# Patient Record
Sex: Female | Born: 1990 | Race: White | Hispanic: No | Marital: Single | Smoking: Never smoker
Health system: Southern US, Community
[De-identification: ages and names within clinical notes are randomized; demographics above are authoritative.]

## PROBLEM LIST (undated history)

## (undated) DIAGNOSIS — N83209 Unspecified ovarian cyst, unspecified side: Secondary | ICD-10-CM

## (undated) DIAGNOSIS — J45909 Unspecified asthma, uncomplicated: Secondary | ICD-10-CM

## (undated) DIAGNOSIS — R51 Headache: Secondary | ICD-10-CM

## (undated) HISTORY — PX: TONSILLECTOMY: SUR1361

---

## 2011-09-02 ENCOUNTER — Encounter (HOSPITAL_COMMUNITY): Payer: Self-pay | Admitting: Emergency Medicine

## 2011-09-02 ENCOUNTER — Emergency Department (HOSPITAL_COMMUNITY)
Admission: EM | Admit: 2011-09-02 | Discharge: 2011-09-03 | Disposition: A | Discharge status: Home / Self Care | Payer: BC Managed Care – HMO | Attending: Emergency Medicine | Admitting: Emergency Medicine

## 2011-09-02 DIAGNOSIS — M79609 Pain in unspecified limb: Secondary | ICD-10-CM | POA: Insufficient documentation

## 2011-09-02 DIAGNOSIS — R252 Cramp and spasm: Secondary | ICD-10-CM

## 2011-09-02 HISTORY — DX: Unspecified asthma, uncomplicated: J45.909

## 2011-09-02 LAB — POCT I-STAT, CHEM 8
BUN: 7 mg/dL (ref 6–23)
Calcium, Ion: 1.17 mmol/L (ref 1.12–1.23)
Chloride: 109 mEq/L (ref 96–112)
Creatinine, Ser: 0.6 mg/dL (ref 0.50–1.10)
Glucose, Bld: 105 mg/dL — ABNORMAL HIGH (ref 70–99)
TCO2: 20 mmol/L (ref 0–100)

## 2011-09-02 MED ORDER — SODIUM CHLORIDE 0.9 % IV BOLUS (SEPSIS)
1000.0000 mL | Freq: Once | INTRAVENOUS | Status: AC
  Administered 2011-09-03: 1000 mL via INTRAVENOUS

## 2011-09-02 MED ORDER — SODIUM CHLORIDE 0.9 % IV BOLUS (SEPSIS)
1000.0000 mL | Freq: Once | INTRAVENOUS | Status: AC
  Administered 2011-09-02: 1000 mL via INTRAVENOUS

## 2011-09-02 NOTE — ED Provider Notes (Signed)
History     CSN: [id number]  Arrival date & time 09/02/11  2118   First MD Initiated Contact with Patient 09/02/11 2227      Chief Complaint  Patient presents with  . Leg Pain    Bilateral calf pain    (Consider location/radiation/quality/duration/timing/severity/associated sxs/prior treatment) Patient is a 21 y.o. female presenting with leg pain. The history is provided by the patient.  Leg Pain   pt here with muscle cramps after running--admits to dehydration--sx better with rest--denies any dark urine. No prior h/o same. No back pain. ems called and pt given iv fluids and now feels better  Past Medical History  Diagnosis Date  . Asthma     Past Surgical History  Procedure Date  . Tonsillectomy     No family history on file.  History  Substance Use Topics  . Smoking status: Never Smoker   . Smokeless tobacco: Not on file  . Alcohol Use: No    OB History    Grav Para Term Preterm Abortions TAB SAB Ect Mult Living                  Review of Systems  All other systems reviewed and are negative.    Allergies  Review of patient's allergies indicates no known allergies.  Home Medications  No current outpatient prescriptions on file.  BP 111/69  Pulse 86  Temp 98.1 F (36.7 C) (Oral)  Resp 16  SpO2 100%  LMP 08/31/2011  Physical Exam  Nursing note and vitals reviewed. Constitutional: She is oriented to person, place, and time. She appears well-developed and well-nourished.  Non-toxic appearance. No distress.  HENT:  Head: Normocephalic and atraumatic.  Eyes: Conjunctivae, EOM and lids are normal. Pupils are equal, round, and reactive to light.  Neck: Normal range of motion. Neck supple. No tracheal deviation present. No mass present.  Cardiovascular: Normal rate, regular rhythm and normal heart sounds.  Exam reveals no gallop.   No murmur heard. Pulmonary/Chest: Effort normal and breath sounds normal. No stridor. No respiratory distress. She has no  decreased breath sounds. She has no wheezes. She has no rhonchi. She has no rales.  Abdominal: Soft. Normal appearance and bowel sounds are normal. She exhibits no distension. There is no tenderness. There is no rebound and no CVA tenderness.  Musculoskeletal: Normal range of motion. She exhibits no edema and no tenderness.  Neurological: She is alert and oriented to person, place, and time. She has normal strength. No cranial nerve deficit or sensory deficit. GCS eye subscore is 4. GCS verbal subscore is 5. GCS motor subscore is 6.  Skin: Skin is warm and dry. No abrasion and no rash noted.  Psychiatric: She has a normal mood and affect. Her speech is normal and behavior is normal.    ED Course  Procedures (including critical care time)  Labs Reviewed - No data to display No results found.   No diagnosis found.    MDM  Pt given iv fluids and she feels better--cramps have resolved and pt stable for d/c        Toy Baker, MD 09/02/11 2349

## 2011-09-02 NOTE — ED Notes (Signed)
PER EMS- pt picked up from [facility] with c/o bilateral calf pain.  Pt went for a run around 8p and afterwards the onset of calf pain started.  Alert and oriented.

## 2012-01-25 ENCOUNTER — Emergency Department (HOSPITAL_COMMUNITY)
Admission: EM | Admit: 2012-01-25 | Discharge: 2012-01-25 | Disposition: A | Discharge status: Home / Self Care | Payer: BC Managed Care – HMO | Attending: Emergency Medicine | Admitting: Emergency Medicine

## 2012-01-25 ENCOUNTER — Encounter (HOSPITAL_COMMUNITY): Payer: Self-pay

## 2012-01-25 DIAGNOSIS — R42 Dizziness and giddiness: Secondary | ICD-10-CM | POA: Insufficient documentation

## 2012-01-25 DIAGNOSIS — Z789 Other specified health status: Secondary | ICD-10-CM

## 2012-01-25 DIAGNOSIS — Z8742 Personal history of other diseases of the female genital tract: Secondary | ICD-10-CM | POA: Insufficient documentation

## 2012-01-25 DIAGNOSIS — R112 Nausea with vomiting, unspecified: Secondary | ICD-10-CM | POA: Insufficient documentation

## 2012-01-25 DIAGNOSIS — F1010 Alcohol abuse, uncomplicated: Secondary | ICD-10-CM | POA: Insufficient documentation

## 2012-01-25 DIAGNOSIS — J45909 Unspecified asthma, uncomplicated: Secondary | ICD-10-CM | POA: Insufficient documentation

## 2012-01-25 DIAGNOSIS — Z3202 Encounter for pregnancy test, result negative: Secondary | ICD-10-CM | POA: Insufficient documentation

## 2012-01-25 DIAGNOSIS — R5383 Other fatigue: Secondary | ICD-10-CM | POA: Insufficient documentation

## 2012-01-25 DIAGNOSIS — R5381 Other malaise: Secondary | ICD-10-CM | POA: Insufficient documentation

## 2012-01-25 HISTORY — DX: Unspecified ovarian cyst, unspecified side: N83.209

## 2012-01-25 LAB — RAPID URINE DRUG SCREEN, HOSP PERFORMED
Amphetamines: NOT DETECTED
Barbiturates: NOT DETECTED
Benzodiazepines: NOT DETECTED
Tetrahydrocannabinol: NOT DETECTED

## 2012-01-25 LAB — URINALYSIS, ROUTINE W REFLEX MICROSCOPIC
Bilirubin Urine: NEGATIVE
Glucose, UA: NEGATIVE mg/dL
Hgb urine dipstick: NEGATIVE
Ketones, ur: NEGATIVE mg/dL
Leukocytes, UA: NEGATIVE
Protein, ur: NEGATIVE mg/dL
pH: 5.5 (ref 5.0–8.0)

## 2012-01-25 LAB — POCT PREGNANCY, URINE: Preg Test, Ur: NEGATIVE

## 2012-01-25 NOTE — ED Provider Notes (Signed)
History    Scribed for Carleene Cooper III, MD, the patient was seen in room TR07C/TR07C . This chart was scribed by Lewanda Rife.  CSN: [id number]  Arrival date & time 01/25/12  1901   First MD Initiated Contact with Patient 01/25/12 2013      Chief Complaint  Patient presents with  . Nausea  . Fatigue    (Consider location/radiation/quality/duration/timing/severity/associated sxs/prior treatment) HPI Madison Strong is a 22 y.o. female who presents to the Emergency Department complaining of constant mild fatigue onset last night. Pt reports she had 2 glasses of alcohol, possibly pot brownies, and pizza last night with unfamiliar "friends." Pt reports waking up this morning "not feeling like myself" with a migraine. Pt reports she feels like sleeping all day, mildly blurry vision, emesis, nausea, and dizzy today. Pt reports she has a low tolerance with alcohol. Pt denies fever, and loss of consciousness. Pt reports she has a hx a migraines. Pt denies having a flu shot this year. Pt denies taking any medications at home to treat migraine.   Past Medical History  Diagnosis Date  . Asthma   . Ovarian cyst     Past Surgical History  Procedure Date  . Tonsillectomy     No family history on file.  History  Substance Use Topics  . Smoking status: Never Smoker   . Smokeless tobacco: Never Used  . Alcohol Use: Yes     Comment: socially on the weekend     OB History    Grav Para Term Preterm Abortions TAB SAB Ect Mult Living                  Review of Systems  Constitutional: Positive for fatigue. Negative for fever.  HENT: Negative.   Respiratory: Negative.   Cardiovascular: Negative.   Gastrointestinal: Positive for nausea and vomiting. Negative for abdominal pain and diarrhea.  Musculoskeletal: Negative.   Skin: Negative.   Neurological: Positive for dizziness.  Hematological: Negative.   Psychiatric/Behavioral: Negative.   All other systems reviewed and are  negative.    Allergies  Other  Home Medications  No current outpatient prescriptions on file.  BP 119/75  Pulse 79  Temp 98 F (36.7 C) (Oral)  Resp 18  SpO2 97%  LMP 10/25/2011  Physical Exam  Nursing note and vitals reviewed. Constitutional: She is oriented to person, place, and time. She appears well-developed and well-nourished. No distress.  HENT:  Head: Normocephalic and atraumatic.  Mouth/Throat: Oropharynx is clear and moist.  Eyes: Conjunctivae normal and EOM are normal.  Cardiovascular: Normal rate and regular rhythm.   Pulmonary/Chest: Effort normal and breath sounds normal. No stridor. No respiratory distress.  Abdominal: She exhibits no distension.  Musculoskeletal: Normal range of motion. She exhibits no edema.  Neurological: She is alert and oriented to person, place, and time. No cranial nerve deficit.       Cranial nerves 2-12 intact. Normal coordination.  Pt ambulates with normal gait in room.  Normal strength.  Normal distal sensation.  Skin: Skin is warm and dry.  Psychiatric: She has a normal mood and affect.    ED Course  Procedures (including critical care time)  Labs Reviewed - No data to display No results found.   No diagnosis found.    MDM  Fatigue, alc consumption last night  Pt presents to the ED not "feeling right" after a night of drinking with unfamiliar people. No focal neuro deficits on exam, non toxic & non septic  appearing. BP 118/81  Pulse 84  Temp 98 F (36.7 C) (Oral)  Resp 18  SpO2 97%  LMP 10/25/2011  Labs reviewed. Strict return precautions discussed.       I personally performed the services described in this documentation, which was scribed in my presence. The recorded information has been reviewed and is accurate.      Jaci Carrel, [place] 01/25/12 2201

## 2012-01-25 NOTE — ED Notes (Signed)
Patient presents with c/o a "hangover." sts that she went out drinking with friends last night and consumed more ETOH than she normally does. Woke up this AM with dizziness, nausea, vomiting and extreme fatigue. Pt sts that she wants to get "checked out" because this is not "a normal feeling" for her after drinking.

## 2012-01-26 NOTE — ED Provider Notes (Signed)
Medical screening examination/treatment/procedure(s) were performed by non-physician practitioner and as supervising physician I was immediately available for consultation/collaboration.   Carleene Cooper III, MD 01/26/12 [phone]

## 2012-04-27 ENCOUNTER — Encounter (HOSPITAL_COMMUNITY): Payer: Self-pay | Admitting: *Deleted

## 2012-04-27 ENCOUNTER — Observation Stay (HOSPITAL_COMMUNITY)
Admission: EM | Admit: 2012-04-27 | Discharge: 2012-04-29 | Disposition: A | Discharge status: Home / Self Care | Payer: BC Managed Care – PPO | Attending: General Surgery | Admitting: General Surgery

## 2012-04-27 DIAGNOSIS — Z9049 Acquired absence of other specified parts of digestive tract: Secondary | ICD-10-CM

## 2012-04-27 DIAGNOSIS — R112 Nausea with vomiting, unspecified: Secondary | ICD-10-CM | POA: Insufficient documentation

## 2012-04-27 DIAGNOSIS — K37 Unspecified appendicitis: Secondary | ICD-10-CM

## 2012-04-27 DIAGNOSIS — K3580 Unspecified acute appendicitis: Principal | ICD-10-CM | POA: Insufficient documentation

## 2012-04-27 DIAGNOSIS — R1011 Right upper quadrant pain: Secondary | ICD-10-CM | POA: Insufficient documentation

## 2012-04-27 DIAGNOSIS — R1031 Right lower quadrant pain: Secondary | ICD-10-CM | POA: Insufficient documentation

## 2012-04-27 HISTORY — DX: Headache: R51

## 2012-04-27 LAB — CBC WITH DIFFERENTIAL/PLATELET
Basophils Absolute: 0 10*3/uL (ref 0.0–0.1)
Basophils Relative: 0 % (ref 0–1)
Eosinophils Absolute: 0.3 10*3/uL (ref 0.0–0.7)
Eosinophils Relative: 3 % (ref 0–5)
HCT: 36.1 % (ref 36.0–46.0)
Hemoglobin: 12.4 g/dL (ref 12.0–15.0)
Lymphocytes Relative: 28 % (ref 12–46)
Lymphs Abs: 2.5 10*3/uL (ref 0.7–4.0)
MCH: 28.8 pg (ref 26.0–34.0)
MCHC: 34.3 g/dL (ref 30.0–36.0)
MCV: 84 fL (ref 78.0–100.0)
Monocytes Absolute: 0.7 10*3/uL (ref 0.1–1.0)
Monocytes Relative: 7 % (ref 3–12)
Neutro Abs: 5.4 10*3/uL (ref 1.7–7.7)
Neutrophils Relative %: 61 % (ref 43–77)
Platelets: 285 10*3/uL (ref 150–400)
RBC: 4.3 MIL/uL (ref 3.87–5.11)
RDW: 12.7 % (ref 11.5–15.5)
WBC: 8.9 10*3/uL (ref 4.0–10.5)

## 2012-04-27 LAB — URINALYSIS, ROUTINE W REFLEX MICROSCOPIC
Bilirubin Urine: NEGATIVE
Glucose, UA: NEGATIVE mg/dL
Hgb urine dipstick: NEGATIVE
Ketones, ur: NEGATIVE mg/dL
Leukocytes, UA: NEGATIVE
Nitrite: NEGATIVE
Protein, ur: NEGATIVE mg/dL
Specific Gravity, Urine: 1.016 (ref 1.005–1.030)
Urobilinogen, UA: 1 mg/dL (ref 0.0–1.0)
pH: 7.5 (ref 5.0–8.0)

## 2012-04-27 LAB — COMPREHENSIVE METABOLIC PANEL
ALT: 13 U/L (ref 0–35)
AST: 17 U/L (ref 0–37)
Albumin: 3.7 g/dL (ref 3.5–5.2)
Alkaline Phosphatase: 51 U/L (ref 39–117)
BUN: 10 mg/dL (ref 6–23)
CO2: 29 mEq/L (ref 19–32)
Calcium: 9.6 mg/dL (ref 8.4–10.5)
Chloride: 105 mEq/L (ref 96–112)
Creatinine, Ser: 0.79 mg/dL (ref 0.50–1.10)
GFR calc Af Amer: >90 mL/min (ref >90)
GFR calc non Af Amer: >90 mL/min (ref >90)
Glucose, Bld: 93 mg/dL (ref 70–99)
Potassium: 3.6 mEq/L (ref 3.5–5.1)
Sodium: 140 mEq/L (ref 135–145)
Total Bilirubin: 0.3 mg/dL (ref 0.3–1.2)
Total Protein: 7.2 g/dL (ref 6.0–8.3)

## 2012-04-27 LAB — URINE MICROSCOPIC-ADD ON

## 2012-04-27 MED ORDER — SODIUM CHLORIDE 0.9 % IV BOLUS (SEPSIS)
1000.0000 mL | Freq: Once | INTRAVENOUS | Status: AC
  Administered 2012-04-27: 1000 mL via INTRAVENOUS

## 2012-04-27 MED ORDER — ONDANSETRON HCL 4 MG/2ML IJ SOLN
INTRAMUSCULAR | Status: AC
  Administered 2012-04-27: 4 mg via INTRAVENOUS
  Filled 2012-04-27: qty 2

## 2012-04-27 MED ORDER — MORPHINE SULFATE 4 MG/ML IJ SOLN
6.0000 mg | Freq: Once | INTRAMUSCULAR | Status: AC
  Administered 2012-04-27: 6 mg via INTRAVENOUS
  Filled 2012-04-27: qty 2

## 2012-04-27 MED ORDER — IOHEXOL 300 MG/ML  SOLN
50.0000 mL | Freq: Once | INTRAMUSCULAR | Status: AC | PRN
  Administered 2012-04-27: 50 mL via ORAL

## 2012-04-27 MED ORDER — ONDANSETRON HCL 4 MG/2ML IJ SOLN
4.0000 mg | Freq: Once | INTRAMUSCULAR | Status: AC
  Administered 2012-04-27: 4 mg via INTRAVENOUS

## 2012-04-27 NOTE — ED Notes (Signed)
Pt complains of sudden onset of cramping pain near belly button that has moved to RLQ and become sharp with N/V

## 2012-04-27 NOTE — ED Notes (Signed)
Pt finished contrast. CT paged.  

## 2012-04-27 NOTE — ED Notes (Signed)
Pt unable to give urine at this time.

## 2012-04-28 ENCOUNTER — Emergency Department (HOSPITAL_COMMUNITY): Payer: BC Managed Care – PPO

## 2012-04-28 ENCOUNTER — Encounter (HOSPITAL_COMMUNITY): Payer: Self-pay | Admitting: Certified Registered"

## 2012-04-28 ENCOUNTER — Encounter (HOSPITAL_COMMUNITY)
Admission: EM | Disposition: A | Discharge status: Home / Self Care | Payer: Self-pay | Source: Home / Self Care | Attending: Emergency Medicine

## 2012-04-28 ENCOUNTER — Observation Stay (HOSPITAL_COMMUNITY): Payer: BC Managed Care – PPO | Admitting: Certified Registered"

## 2012-04-28 ENCOUNTER — Encounter (HOSPITAL_COMMUNITY): Payer: Self-pay | Admitting: Radiology

## 2012-04-28 DIAGNOSIS — K3580 Unspecified acute appendicitis: Secondary | ICD-10-CM

## 2012-04-28 DIAGNOSIS — Z9049 Acquired absence of other specified parts of digestive tract: Secondary | ICD-10-CM

## 2012-04-28 HISTORY — PX: LAPAROSCOPIC APPENDECTOMY: SHX408

## 2012-04-28 HISTORY — PX: APPENDECTOMY: SHX54

## 2012-04-28 SURGERY — APPENDECTOMY, LAPAROSCOPIC
Anesthesia: General | Site: Abdomen | Wound class: Clean Contaminated

## 2012-04-28 MED ORDER — PROPOFOL 10 MG/ML IV BOLUS
INTRAVENOUS | Status: DC | PRN
  Administered 2012-04-28: 180 mg via INTRAVENOUS

## 2012-04-28 MED ORDER — ONDANSETRON HCL 4 MG PO TABS: 4.0000 mg | ORAL_TABLET | Freq: Three times a day (TID) | ORAL | Status: DC | PRN

## 2012-04-28 MED ORDER — SODIUM CHLORIDE 0.9 % IR SOLN
Status: DC | PRN
  Administered 2012-04-28: 1000 mL

## 2012-04-28 MED ORDER — IOHEXOL 300 MG/ML  SOLN
100.0000 mL | Freq: Once | INTRAMUSCULAR | Status: AC | PRN
Start: 2012-04-28 — End: 2012-04-28
  Administered 2012-04-28: 100 mL via INTRAVENOUS

## 2012-04-28 MED ORDER — MIDAZOLAM HCL 5 MG/5ML IJ SOLN
INTRAMUSCULAR | Status: DC | PRN
  Administered 2012-04-28: 2 mg via INTRAVENOUS

## 2012-04-28 MED ORDER — SODIUM CHLORIDE 0.9 % IV BOLUS (SEPSIS)
1000.0000 mL | Freq: Once | INTRAVENOUS | Status: AC
  Administered 2012-04-28: 1000 mL via INTRAVENOUS

## 2012-04-28 MED ORDER — ONDANSETRON HCL 4 MG/2ML IJ SOLN
4.0000 mg | Freq: Four times a day (QID) | INTRAMUSCULAR | Status: DC | PRN
  Administered 2012-04-28: 4 mg via INTRAVENOUS
  Filled 2012-04-28 (×2): qty 2

## 2012-04-28 MED ORDER — SUCCINYLCHOLINE CHLORIDE 20 MG/ML IJ SOLN
INTRAMUSCULAR | Status: DC | PRN
  Administered 2012-04-28: 120 mg via INTRAVENOUS

## 2012-04-28 MED ORDER — ONDANSETRON HCL 4 MG/2ML IJ SOLN
INTRAMUSCULAR | Status: DC | PRN
  Administered 2012-04-28: 4 mg via INTRAVENOUS

## 2012-04-28 MED ORDER — DEXTROSE IN LACTATED RINGERS 5 % IV SOLN
INTRAVENOUS | Status: DC
  Administered 2012-04-28: 03:00:00 via INTRAVENOUS

## 2012-04-28 MED ORDER — BUPIVACAINE HCL 0.25 % IJ SOLN
INTRAMUSCULAR | Status: DC | PRN
  Administered 2012-04-28: 2 mL

## 2012-04-28 MED ORDER — LACTATED RINGERS IV SOLN
INTRAVENOUS | Status: DC | PRN
  Administered 2012-04-28 (×2): via INTRAVENOUS

## 2012-04-28 MED ORDER — HYDROMORPHONE HCL PF 1 MG/ML IJ SOLN
0.5000 mg | INTRAMUSCULAR | Status: DC | PRN
  Administered 2012-04-28: 0.5 mg via INTRAVENOUS
  Filled 2012-04-28: qty 1

## 2012-04-28 MED ORDER — SUFENTANIL CITRATE 50 MCG/ML IV SOLN
INTRAVENOUS | Status: DC | PRN
  Administered 2012-04-28: 10 ug via INTRAVENOUS

## 2012-04-28 MED ORDER — SODIUM CHLORIDE 0.9 % IV SOLN
1.0000 g | Freq: Once | INTRAVENOUS | Status: AC
  Administered 2012-04-28: 1 g via INTRAVENOUS
  Filled 2012-04-28: qty 1

## 2012-04-28 MED ORDER — OXYCODONE HCL 5 MG PO TABS
5.0000 mg | ORAL_TABLET | ORAL | Status: DC | PRN
  Administered 2012-04-28 – 2012-04-29 (×5): 5 mg via ORAL
  Filled 2012-04-28 (×5): qty 1

## 2012-04-28 MED ORDER — DEXAMETHASONE SODIUM PHOSPHATE 4 MG/ML IJ SOLN
INTRAMUSCULAR | Status: DC | PRN
Start: 2012-04-28 — End: 2012-04-28
  Administered 2012-04-28: 4 mg via INTRAVENOUS

## 2012-04-28 MED ORDER — OXYCODONE-ACETAMINOPHEN 5-325 MG PO TABS: 1.0000 | ORAL_TABLET | Freq: Four times a day (QID) | ORAL | Status: DC | PRN

## 2012-04-28 MED ORDER — GLYCOPYRROLATE 0.2 MG/ML IJ SOLN
INTRAMUSCULAR | Status: DC | PRN
  Administered 2012-04-28: 0.4 mg via INTRAVENOUS

## 2012-04-28 MED ORDER — HYDROMORPHONE HCL PF 1 MG/ML IJ SOLN
0.2500 mg | INTRAMUSCULAR | Status: DC | PRN
  Administered 2012-04-28 (×2): 0.5 mg via INTRAVENOUS

## 2012-04-28 MED ORDER — NEOSTIGMINE METHYLSULFATE 1 MG/ML IJ SOLN
INTRAMUSCULAR | Status: DC | PRN
  Administered 2012-04-28: 3 mg via INTRAVENOUS

## 2012-04-28 MED ORDER — LIDOCAINE HCL 4 % MT SOLN
OROMUCOSAL | Status: DC | PRN
  Administered 2012-04-28: 4 mL via TOPICAL

## 2012-04-28 MED ORDER — LIDOCAINE HCL (CARDIAC) 20 MG/ML IV SOLN
INTRAVENOUS | Status: DC | PRN
  Administered 2012-04-28: 60 mg via INTRAVENOUS

## 2012-04-28 MED ORDER — SODIUM CHLORIDE 0.9 % IV SOLN
INTRAVENOUS | Status: DC
  Administered 2012-04-28: 23:00:00 via INTRAVENOUS

## 2012-04-28 MED ORDER — PROMETHAZINE HCL 25 MG/ML IJ SOLN: 6.2500 mg | INTRAMUSCULAR | Status: DC | PRN

## 2012-04-28 MED ORDER — ROCURONIUM BROMIDE 100 MG/10ML IV SOLN
INTRAVENOUS | Status: DC | PRN
  Administered 2012-04-28: 20 mg via INTRAVENOUS

## 2012-04-28 MED ORDER — HYDROMORPHONE HCL PF 1 MG/ML IJ SOLN
1.0000 mg | INTRAMUSCULAR | Status: DC | PRN
  Administered 2012-04-28: 1 mg via INTRAVENOUS
  Filled 2012-04-28: qty 1

## 2012-04-28 MED ORDER — HYDROMORPHONE HCL PF 1 MG/ML IJ SOLN
INTRAMUSCULAR | Status: AC
  Filled 2012-04-28: qty 1

## 2012-04-28 MED ORDER — IBUPROFEN 200 MG PO TABS: 400.0000 mg | ORAL_TABLET | Freq: Three times a day (TID) | ORAL | Status: DC | PRN

## 2012-04-28 SURGICAL SUPPLY — 44 items
APPLIER CLIP 5 13 M/L LIGAMAX5 (MISCELLANEOUS) ×2
BENZOIN TINCTURE PRP APPL 2/3 (GAUZE/BANDAGES/DRESSINGS) ×2 IMPLANT
BLADE SURG ROTATE 9660 (MISCELLANEOUS) IMPLANT
CANISTER SUCTION 2500CC (MISCELLANEOUS) ×2 IMPLANT
CHLORAPREP W/TINT 26ML (MISCELLANEOUS) ×2 IMPLANT
CLIP APPLIE 5 13 M/L LIGAMAX5 (MISCELLANEOUS) ×1 IMPLANT
CLOTH BEACON ORANGE TIMEOUT ST (SAFETY) ×2 IMPLANT
CLSR STERI-STRIP ANTIMIC 1/2X4 (GAUZE/BANDAGES/DRESSINGS) ×2 IMPLANT
COVER SURGICAL LIGHT HANDLE (MISCELLANEOUS) ×2 IMPLANT
COVER TRANSDUCER ULTRASND (DRAPES) ×2 IMPLANT
DECANTER SPIKE VIAL GLASS SM (MISCELLANEOUS) ×2 IMPLANT
DEVICE TROCAR PUNCTURE CLOSURE (ENDOMECHANICALS) ×2 IMPLANT
DRAPE UTILITY 15X26 W/TAPE STR (DRAPE) ×8 IMPLANT
DRSG COVADERM 4X6 (GAUZE/BANDAGES/DRESSINGS) ×2 IMPLANT
ELECT REM PT RETURN 9FT ADLT (ELECTROSURGICAL) ×2
ELECTRODE REM PT RTRN 9FT ADLT (ELECTROSURGICAL) ×1 IMPLANT
ENDOLOOP SUT PDS II  0 18 (SUTURE) ×3
ENDOLOOP SUT PDS II 0 18 (SUTURE) ×3 IMPLANT
GAUZE SPONGE 2X2 8PLY STRL LF (GAUZE/BANDAGES/DRESSINGS) ×1 IMPLANT
GLOVE BIO SURGEON STRL SZ7.5 (GLOVE) ×2 IMPLANT
GLOVE BIOGEL PI IND STRL 7.0 (GLOVE) ×2 IMPLANT
GLOVE BIOGEL PI INDICATOR 7.0 (GLOVE) ×2
GLOVE SURG SS PI 6.5 STRL IVOR (GLOVE) ×2 IMPLANT
GOWN STRL NON-REIN LRG LVL3 (GOWN DISPOSABLE) ×4 IMPLANT
GOWN STRL REIN XL XLG (GOWN DISPOSABLE) ×2 IMPLANT
KIT BASIN OR (CUSTOM PROCEDURE TRAY) ×2 IMPLANT
KIT ROOM TURNOVER OR (KITS) ×2 IMPLANT
NEEDLE INSUFFLATION 14GA 120MM (NEEDLE) ×2 IMPLANT
NS IRRIG 1000ML POUR BTL (IV SOLUTION) ×2 IMPLANT
PAD ARMBOARD 7.5X6 YLW CONV (MISCELLANEOUS) ×4 IMPLANT
SCISSORS LAP 5X35 DISP (ENDOMECHANICALS) ×2 IMPLANT
SET IRRIG TUBING LAPAROSCOPIC (IRRIGATION / IRRIGATOR) ×2 IMPLANT
SLEEVE ENDOPATH XCEL 5M (ENDOMECHANICALS) ×2 IMPLANT
SPECIMEN JAR SMALL (MISCELLANEOUS) ×2 IMPLANT
SPONGE GAUZE 2X2 STER 10/PKG (GAUZE/BANDAGES/DRESSINGS) ×1
SUT MNCRL AB 3-0 PS2 18 (SUTURE) ×2 IMPLANT
SUT VIC AB 1 BRD 54 (SUTURE) ×2 IMPLANT
TAPE CLOTH SOFT 2X10 (GAUZE/BANDAGES/DRESSINGS) ×2 IMPLANT
TOWEL OR 17X24 6PK STRL BLUE (TOWEL DISPOSABLE) ×2 IMPLANT
TOWEL OR 17X26 10 PK STRL BLUE (TOWEL DISPOSABLE) ×2 IMPLANT
TRAY FOLEY CATH 14FR (SET/KITS/TRAYS/PACK) ×2 IMPLANT
TRAY LAPAROSCOPIC (CUSTOM PROCEDURE TRAY) ×2 IMPLANT
TROCAR XCEL NON-BLD 11X100MML (ENDOMECHANICALS) ×2 IMPLANT
TROCAR XCEL NON-BLD 5MMX100MML (ENDOMECHANICALS) ×2 IMPLANT

## 2012-04-28 NOTE — ED Provider Notes (Signed)
1:58 AM PT evaluated - she has RLQ TTP and rebound/ guarding. CT scan unable to visualize appendix. Pelvic was performed by [facility]. Discussed with [facility] DR Derrell Lolling - will see in the ED. NPO, IVFs.   Results for orders placed during the hospital encounter of 04/27/12  CBC WITH DIFFERENTIAL      Result Value Range   WBC 8.9  4.0 - 10.5 K/uL   RBC 4.30  3.87 - 5.11 MIL/uL   Hemoglobin 12.4  12.0 - 15.0 g/dL   HCT 29.5  62.1 - 30.8 %   MCV 84.0  78.0 - 100.0 fL   MCH 28.8  26.0 - 34.0 pg   MCHC 34.3  30.0 - 36.0 g/dL   RDW 65.7  84.6 - 96.2 %   Platelets 285  150 - 400 K/uL   Neutrophils Relative 61  43 - 77 %   Neutro Abs 5.4  1.7 - 7.7 K/uL   Lymphocytes Relative 28  12 - 46 %   Lymphs Abs 2.5  0.7 - 4.0 K/uL   Monocytes Relative 7  3 - 12 %   Monocytes Absolute 0.7  0.1 - 1.0 K/uL   Eosinophils Relative 3  0 - 5 %   Eosinophils Absolute 0.3  0.0 - 0.7 K/uL   Basophils Relative 0  0 - 1 %   Basophils Absolute 0.0  0.0 - 0.1 K/uL  URINALYSIS, ROUTINE W REFLEX MICROSCOPIC      Result Value Range   Color, Urine YELLOW  YELLOW   APPearance TURBID (*) CLEAR   Specific Gravity, Urine 1.016  1.005 - 1.030   pH 7.5  5.0 - 8.0   Glucose, UA NEGATIVE  NEGATIVE mg/dL   Hgb urine dipstick NEGATIVE  NEGATIVE   Bilirubin Urine NEGATIVE  NEGATIVE   Ketones, ur NEGATIVE  NEGATIVE mg/dL   Protein, ur NEGATIVE  NEGATIVE mg/dL   Urobilinogen, UA 1.0  0.0 - 1.0 mg/dL   Nitrite NEGATIVE  NEGATIVE   Leukocytes, UA NEGATIVE  NEGATIVE  COMPREHENSIVE METABOLIC PANEL      Result Value Range   Sodium 140  135 - 145 mEq/L   Potassium 3.6  3.5 - 5.1 mEq/L   Chloride 105  96 - 112 mEq/L   CO2 29  19 - 32 mEq/L   Glucose, Bld 93  70 - 99 mg/dL   BUN 10  6 - 23 mg/dL   Creatinine, Ser 9.52  0.50 - 1.10 mg/dL   Calcium 9.6  8.4 - 84.1 mg/dL   Total Protein 7.2  6.0 - 8.3 g/dL   Albumin 3.7  3.5 - 5.2 g/dL   AST 17  0 - 37 U/L   ALT 13  0 - 35 U/L   Alkaline Phosphatase 51  39 - 117 U/L   Total Bilirubin  0.3  0.3 - 1.2 mg/dL   GFR calc non Af Amer >90  >90 mL/min   GFR calc Af Amer >90  >90 mL/min  URINE MICROSCOPIC-ADD ON      Result Value Range   Squamous Epithelial / LPF RARE  RARE   Bacteria, UA MANY (*) RARE   Urine-Other AMORPHOUS MATERIAL PRESENT    POCT PREGNANCY, URINE      Result Value Range   Preg Test, Ur NEGATIVE  NEGATIVE   Ct Abdomen Pelvis W Contrast  04/28/2012  *RADIOLOGY REPORT*  Clinical Data: Right lower quadrant pain  CT ABDOMEN AND PELVIS WITH CONTRAST  Technique:  Multidetector CT  imaging of the abdomen and pelvis was performed following the standard protocol during bolus administration of intravenous contrast.  Contrast: OMNIPAQUE IOHEXOL 300 MG/ML  SOLN  Comparison: None.  Findings: Limited images through the lung bases demonstrate no significant appreciable abnormality. The heart size is within normal limits. No pleural or pericardial effusion.  Hypodensity adjacent to the falciform ligament, presumably focal fat or variant perfusion.  Otherwise, homogeneous hepatic enhancement.  Unremarkable spleen, biliary system, pancreas, adrenal glands.  Symmetric renal enhancement.  No hydronephrosis or hydroureter.  No CT evidence for colitis.  Appendix not confidently identified. No right lower quadrant inflammation.  No free intraperitoneal air or fluid.  No bowel obstruction.  Small bowel loops are normal course and caliber. No lymphadenopathy.  Normal caliber aorta and branch vessels.  Partially decompressed bladder.  Nonspecific appearance to the uterus and adnexa.  No acute osseous finding.  IMPRESSION: Appendix not identified, therefore cannot exclude appendicitis. However, no right lower quadrant inflammation at this time.   Original Report Authenticated By: Jearld Lesch, M.D.       Sunnie Nielsen, MD 04/28/12 0200

## 2012-04-28 NOTE — ED Notes (Signed)
MD at bedside. 

## 2012-04-28 NOTE — H&P (Signed)
Madison Strong is an 22 y.o. female.   Chief Complaint: abdominal pain HPI: the patient is a 22 year old female with a less than 24-hour history of periumbilical pain localizing to the right lower quadrant. Patient states that the pain was followed by nausea and emesis when she was in the ED. She states she's had history of left-sided cyst, and this pain is not similar to that.  Past Medical History  Diagnosis Date  . Asthma   . Ovarian cyst     Past Surgical History  Procedure Laterality Date  . Tonsillectomy      No family history on file. Social History:  reports that she has never smoked. She has never used smokeless tobacco. She reports that  drinks alcohol. She reports that she does not use illicit drugs.  Allergies:  Allergies  Allergen Reactions  . Other Anaphylaxis    Mosquito   . Doxycycline Other (See Comments)    Caused nose bleeds     (Not in a hospital admission)  Results for orders placed during the hospital encounter of 04/27/12 (from the past 48 hour(s))  CBC WITH DIFFERENTIAL     Status: None   Collection Time    04/27/12  9:54 PM      Result Value Range   WBC 8.9  4.0 - 10.5 K/uL   RBC 4.30  3.87 - 5.11 MIL/uL   Hemoglobin 12.4  12.0 - 15.0 g/dL   HCT 16.1  09.6 - 04.5 %   MCV 84.0  78.0 - 100.0 fL   MCH 28.8  26.0 - 34.0 pg   MCHC 34.3  30.0 - 36.0 g/dL   RDW 40.9  81.1 - 91.4 %   Platelets 285  150 - 400 K/uL   Neutrophils Relative 61  43 - 77 %   Neutro Abs 5.4  1.7 - 7.7 K/uL   Lymphocytes Relative 28  12 - 46 %   Lymphs Abs 2.5  0.7 - 4.0 K/uL   Monocytes Relative 7  3 - 12 %   Monocytes Absolute 0.7  0.1 - 1.0 K/uL   Eosinophils Relative 3  0 - 5 %   Eosinophils Absolute 0.3  0.0 - 0.7 K/uL   Basophils Relative 0  0 - 1 %   Basophils Absolute 0.0  0.0 - 0.1 K/uL  COMPREHENSIVE METABOLIC PANEL     Status: None   Collection Time    04/27/12  9:54 PM      Result Value Range   Sodium 140  135 - 145 mEq/L   Potassium 3.6  3.5 - 5.1 mEq/L    Chloride 105  96 - 112 mEq/L   CO2 29  19 - 32 mEq/L   Glucose, Bld 93  70 - 99 mg/dL   BUN 10  6 - 23 mg/dL   Creatinine, Ser 7.82  0.50 - 1.10 mg/dL   Calcium 9.6  8.4 - 95.6 mg/dL   Total Protein 7.2  6.0 - 8.3 g/dL   Albumin 3.7  3.5 - 5.2 g/dL   AST 17  0 - 37 U/L   ALT 13  0 - 35 U/L   Alkaline Phosphatase 51  39 - 117 U/L   Total Bilirubin 0.3  0.3 - 1.2 mg/dL   GFR calc non Af Amer >90  >90 mL/min   GFR calc Af Amer >90  >90 mL/min   Comment:            The eGFR has been  calculated     using the CKD EPI equation.     This calculation has not been     validated in all clinical     situations.     eGFR's persistently     <90 mL/min signify     possible Chronic Kidney Disease.  URINALYSIS, ROUTINE W REFLEX MICROSCOPIC     Status: Abnormal   Collection Time    04/27/12 10:36 PM      Result Value Range   Color, Urine YELLOW  YELLOW   APPearance TURBID (*) CLEAR   Specific Gravity, Urine 1.016  1.005 - 1.030   pH 7.5  5.0 - 8.0   Glucose, UA NEGATIVE  NEGATIVE mg/dL   Hgb urine dipstick NEGATIVE  NEGATIVE   Bilirubin Urine NEGATIVE  NEGATIVE   Ketones, ur NEGATIVE  NEGATIVE mg/dL   Protein, ur NEGATIVE  NEGATIVE mg/dL   Urobilinogen, UA 1.0  0.0 - 1.0 mg/dL   Nitrite NEGATIVE  NEGATIVE   Leukocytes, UA NEGATIVE  NEGATIVE  URINE MICROSCOPIC-ADD ON     Status: Abnormal   Collection Time    04/27/12 10:36 PM      Result Value Range   Squamous Epithelial / LPF RARE  RARE   Bacteria, UA MANY (*) RARE   Urine-Other AMORPHOUS MATERIAL PRESENT    POCT PREGNANCY, URINE     Status: None   Collection Time    04/27/12 10:43 PM      Result Value Range   Preg Test, Ur NEGATIVE  NEGATIVE   Comment:            THE SENSITIVITY OF THIS     METHODOLOGY IS >24 mIU/mL   Ct Abdomen Pelvis W Contrast  04/28/2012  *RADIOLOGY REPORT*  Clinical Data: Right lower quadrant pain  CT ABDOMEN AND PELVIS WITH CONTRAST  Technique:  Multidetector CT imaging of the abdomen and pelvis was  performed following the standard protocol during bolus administration of intravenous contrast.  Contrast: OMNIPAQUE IOHEXOL 300 MG/ML  SOLN  Comparison: None.  Findings: Limited images through the lung bases demonstrate no significant appreciable abnormality. The heart size is within normal limits. No pleural or pericardial effusion.  Hypodensity adjacent to the falciform ligament, presumably focal fat or variant perfusion.  Otherwise, homogeneous hepatic enhancement.  Unremarkable spleen, biliary system, pancreas, adrenal glands.  Symmetric renal enhancement.  No hydronephrosis or hydroureter.  No CT evidence for colitis.  Appendix not confidently identified. No right lower quadrant inflammation.  No free intraperitoneal air or fluid.  No bowel obstruction.  Small bowel loops are normal course and caliber. No lymphadenopathy.  Normal caliber aorta and branch vessels.  Partially decompressed bladder.  Nonspecific appearance to the uterus and adnexa.  No acute osseous finding.  IMPRESSION: Appendix not identified, therefore cannot exclude appendicitis. However, no right lower quadrant inflammation at this time.   Original Report Authenticated By: Jearld Lesch, M.D.     Review of Systems  Constitutional: Positive for malaise/fatigue.  HENT: Negative.   Eyes: Negative.   Respiratory: Negative.   Cardiovascular: Negative.   Gastrointestinal: Positive for nausea, vomiting and abdominal pain. Negative for diarrhea and constipation.  Genitourinary: Negative.   Musculoskeletal: Negative.   Skin: Negative.   Neurological: Negative.     Blood pressure 101/59, pulse 80, temperature 98.4 F (36.9 C), temperature source Oral, resp. rate 16, last menstrual period 04/13/2012, SpO2 99.00%. Physical Exam  Constitutional: She is oriented to person, place, and time. She appears well-developed  and well-nourished.  HENT:  Head: Normocephalic and atraumatic.  Eyes: Conjunctivae and EOM are normal. Pupils  are equal, round, and reactive to light.  Neck: Normal range of motion. Neck supple.  Cardiovascular: Normal rate and regular rhythm.   Respiratory: Effort normal and breath sounds normal.  GI: Soft. She exhibits no mass. There is tenderness. There is rebound and guarding.  Positive Rovnig's sign, Positive obturator sign  Musculoskeletal: Normal range of motion.  Neurological: She is alert and oriented to person, place, and time.     Assessment/Plan 22 year old female with physical exam findings consistent with acute appendicitis. I discussed with the patient that even though the CT scan did not reveal acute appendicitis is to likely based on her physical exam findings and history. Will proceed for laparoscopic appendectomy. All risks and benefits were discussed with the patient to include bleeding, infection, damage to surrounding structures, and  Leak.  The patient voiced understanding and wished to proceed with the procedure. Marigene Ehlers., Rosendo Couser 04/28/2012, 3:12 AM

## 2012-04-28 NOTE — ED Provider Notes (Signed)
History     CSN: [id number]  Arrival date & time 04/27/12  2048   First MD Initiated Contact with Patient 04/27/12 2124      Chief Complaint  Patient presents with  . Abdominal Pain  . Emesis    (Consider location/radiation/quality/duration/timing/severity/associated sxs/prior treatment) Patient is a 22 y.o. female presenting with abdominal pain and vomiting.  Abdominal Pain Associated symptoms: vomiting   Emesis Associated symptoms: abdominal pain    Patient presents emergency department with right lower quadrant abdominal pain, that began around noon today.  Patient, states, that pain started around her bellybutton and then moved to her right lower quadrant.  Patient, states, that movement and palpation and walking make her pain, worse.  Patient denies chest pain, shortness of breath, diarrhea, bloody stools, fever, headache, blurred vision, rash, dizziness, or syncope.  Patient, states she's had nausea and vomiting associated with her pain.  Patient also denies vaginal discharge or bleeding.  Patient, states she did not take anything prior to arrival for her symptoms Past Medical History  Diagnosis Date  . Asthma   . Ovarian cyst     Past Surgical History  Procedure Laterality Date  . Tonsillectomy      No family history on file.  History  Substance Use Topics  . Smoking status: Never Smoker   . Smokeless tobacco: Never Used  . Alcohol Use: Yes     Comment: socially on the weekend     OB History   Grav Para Term Preterm Abortions TAB SAB Ect Mult Living                  Review of Systems  Gastrointestinal: Positive for vomiting and abdominal pain.   All other systems negative except as documented in the HPI. All pertinent positives and negatives as reviewed in the HPI. Allergies  Other and Doxycycline  Home Medications   Current Outpatient Rx  Name  Route  Sig  Dispense  Refill  . Green Tea POWD   Does not apply   2 tablets by Does not apply route 2  (two) times daily.         . Multiple Vitamin (MULTIVITAMIN WITH MINERALS) TABS   Oral   Take 1 tablet by mouth daily.           BP 124/84  Pulse 58  Temp(Src) 98.3 F (36.8 C) (Oral)  Resp 14  SpO2 100%  LMP 04/13/2012  Physical Exam  Nursing note and vitals reviewed. Constitutional: She is oriented to person, place, and time. She appears well-developed and well-nourished. No distress.  HENT:  Head: Normocephalic and atraumatic.  Mouth/Throat: Oropharynx is clear and moist.  Eyes: Pupils are equal, round, and reactive to light.  Neck: Normal range of motion. Neck supple.  Cardiovascular: Normal rate, regular rhythm and normal heart sounds.  Exam reveals no gallop and no friction rub.   No murmur heard. Pulmonary/Chest: Effort normal and breath sounds normal. No respiratory distress.  Abdominal: Normal appearance and bowel sounds are normal. There is tenderness in the right lower quadrant. There is rebound and guarding. There is no rigidity and no CVA tenderness.    Neurological: She is alert and oriented to person, place, and time. She exhibits normal muscle tone. Coordination normal.  Skin: Skin is warm and dry. No rash noted. No erythema.    ED Course  Procedures (including critical care time)  Labs Reviewed  URINALYSIS, ROUTINE W REFLEX MICROSCOPIC - Abnormal; Notable for the following:  APPearance TURBID (*)    All other components within normal limits  URINE MICROSCOPIC-ADD ON - Abnormal; Notable for the following:    Bacteria, UA MANY (*)    All other components within normal limits  CBC WITH DIFFERENTIAL  COMPREHENSIVE METABOLIC PANEL  POCT PREGNANCY, URINE   patient was observed by Dr. Dierdre Highman, who will speak with surgery, about the patient.  Patient is, otherwise stable.  Reexamination, she is still exquisitely tender, with guarding and rebound in the right lower quadrant.  Patient is advised of the results, plan    MDM  MDM Reviewed: vitals and  nursing note Interpretation: labs and CT scan            Carlyle Dolly, PA-C 04/29/12 0203

## 2012-04-28 NOTE — ED Notes (Signed)
RN paged CT about pt status for CT scan

## 2012-04-28 NOTE — Transfer of Care (Signed)
Immediate Anesthesia Transfer of Care Note  Patient: Madison Strong  Procedure(s) Performed: Procedure(s): APPENDECTOMY LAPAROSCOPIC (N/A)  Patient Location: PACU  Anesthesia Type:General  Level of Consciousness: awake, alert , oriented and patient cooperative  Airway & Oxygen Therapy: Patient Spontanous Breathing and Patient connected to nasal cannula oxygen  Post-op Assessment: Report given to PACU RN, Post -op Vital signs reviewed and stable and Patient moving all extremities X 4  Post vital signs: Reviewed and stable  Complications: No apparent anesthesia complications

## 2012-04-28 NOTE — ED Notes (Signed)
Surgeon at bedside.  

## 2012-04-28 NOTE — ED Notes (Signed)
Pt transported to CT ?

## 2012-04-28 NOTE — Anesthesia Postprocedure Evaluation (Signed)
  Anesthesia Post-op Note  Patient: Chemical engineer  Procedure(s) Performed: Procedure(s): APPENDECTOMY LAPAROSCOPIC (N/A)  Patient Location: PACU  Anesthesia Type:General  Level of Consciousness: awake  Airway and Oxygen Therapy: Patient Spontanous Breathing  Post-op Pain: mild  Post-op Assessment: Post-op Vital signs reviewed, Patient's Cardiovascular Status Stable, Respiratory Function Stable, Patent Airway and Pain level controlled  Post-op Vital Signs: stable  Complications: No apparent anesthesia complications

## 2012-04-28 NOTE — ED Notes (Signed)
Lab paged to send [facility] stat

## 2012-04-28 NOTE — Progress Notes (Signed)
Day of Surgery  Subjective: Pt drowsy, but easily arousable.  Denies fever or chills.  Denies nausea or vomiting.  Complains of abdominal pain with movement.  Has not voided, -flauts/bm.  Has not been up or eaten breakfast.  Mother and father at bedside.  Pt requested to have oxygen removed, which was done.  Asked nursing to recheck 02 sat in and call.  Objective: Vital signs in last 24 hours: Temp:  [96.7 F (35.9 C)-98.4 F (36.9 C)] 97.5 F (36.4 C) (04/23 0627) Pulse Rate:  [55-94] 78 (04/23 0627) Resp:  [10-21] 18 (04/23 0627) BP: (92-124)/(55-84) 106/59 mmHg (04/23 0627) SpO2:  [96 %-100 %] 100 % (04/23 0627) Weight:  [120 lb (54.432 kg)] 120 lb (54.432 kg) (04/23 0358)    Intake/Output from previous day: 04/22 0701 - 04/23 0700 In: 3300 [I.V.:3300] Out: 350 [Urine:350] Intake/Output this shift:    General appearance: alert, cooperative and no distress Resp: clear to auscultation bilaterally Cardio: regular rate and rhythm, S1, S2 normal, no murmur, click, rub or gallop GI: soft, round, mild tenderness, +bs x4 quadrants.  No masses, hernias or organomegaly.  Incisions are clean dry and intact  Lab Results:   Recent Labs  04/27/12 2154  WBC 8.9  HGB 12.4  HCT 36.1  PLT 285   BMET  Recent Labs  04/27/12 2154  NA 140  K 3.6  CL 105  CO2 29  GLUCOSE 93  BUN 10  CREATININE 0.79  CALCIUM 9.6   PT/INR No results found for this basename: LABPROT, INR,  in the last 72 hours ABG No results found for this basename: PHART, PCO2, PO2, HCO3,  in the last 72 hours  Studies/Results: Ct Abdomen Pelvis W Contrast  04/28/2012  *RADIOLOGY REPORT*  Clinical Data: Right lower quadrant pain  CT ABDOMEN AND PELVIS WITH CONTRAST  Technique:  Multidetector CT imaging of the abdomen and pelvis was performed following the standard protocol during bolus administration of intravenous contrast.  Contrast: OMNIPAQUE IOHEXOL 300 MG/ML  SOLN  Comparison: None.   Findings: Limited images through the lung bases demonstrate no significant appreciable abnormality. The heart size is within normal limits. No pleural or pericardial effusion.  Hypodensity adjacent to the falciform ligament, presumably focal fat or variant perfusion.  Otherwise, homogeneous hepatic enhancement.  Unremarkable spleen, biliary system, pancreas, adrenal glands.  Symmetric renal enhancement.  No hydronephrosis or hydroureter.  No CT evidence for colitis.  Appendix not confidently identified. No right lower quadrant inflammation.  No free intraperitoneal air or fluid.  No bowel obstruction.  Small bowel loops are normal course and caliber. No lymphadenopathy.  Normal caliber aorta and branch vessels.  Partially decompressed bladder.  Nonspecific appearance to the uterus and adnexa.  No acute osseous finding.  IMPRESSION: Appendix not identified, therefore cannot exclude appendicitis. However, no right lower quadrant inflammation at this time.   Original Report Authenticated By: Jearld Lesch, M.D.     Anti-infectives: Anti-infectives   Start     Dose/Rate Route Frequency Ordered Stop   04/28/12 0315  ertapenem (INVANZ) 1 g in sodium chloride 0.9 % 50 mL IVPB     1 g 100 mL/hr over 30 Minutes Intravenous  Once 04/28/12 0304 04/28/12 0358      Assessment/Plan: Laparoscopic Appendectomy without perforation -ambulate -pain control, hold for oversedation. -IV fluids, then saline lock when tolerating oral fluids. -Advance diet as tolerated. -May be discharged later today if pt has voided, tolerating diet and pain adequately controlled with  oral medication -oxygen discontinued, after dc 02 sat 93% -will check on pt later   LOS: 1 day    Brigetta Beckstrom  ANP-BC  04/28/2012

## 2012-04-28 NOTE — Op Note (Signed)
Pre Operative Diagnosis:  Acute appendicitis  Post Operative Diagnosis: same  Procedure: Laparoscopic appendectomy  Surgeon: Dr. Axel Filler  Assistant: none  Anesthesia: GETA  EBL:  <5 cc  Complications: none  Counts: reported as correct x 2  Findings:  The patient had an acutely tip inflamed nonperforated appendix  Specimen: Appendix  Indications for procedure:  The patient is a 22 year old female with a history of periumbilical pain localized in the right lower quadrant patient had a CT scan could not visualize the appendix, but however After counseling and secondary to her physical exam and history the patient opted for lap appendectomy  Details of the procedure:The patient was taken back to the operating room. The patient was placed in supine position with bilateral SCDs in place. After appropriate anitbiotics were confirmed, a time-out was confirmed and all facts were verified.  A pneumoperitoneum of 14 mmHg was obtained via a Veress needle technique in the left lower quadrant quadrant.  A 5 mm trocar and 5 mm camera then placed intra-abdominally there is no injury to any intra-abdominal organs a 10 mm infraumbilical port was placed and direct visualization as was a 5 mm port in the suprapubic area. The appendix was identified  The appendix identified and cleaned down to the appendiceal base. The appendiceal artery was taken with Bovie cautery maintaining hemostasis, the mesoappendix was then incised.  The the appendiceal base was clean.  At this time an Endoloop was placed proximallyx2 and one distally and the appendix was transected between these 2. Then an Endo Catch bag was then placed into the abdomen and the specimen placed in Endo Catch bag. The appendiceal stump was cauterized. We evacuate the fluid from the pelvis until the effluent was clear. What omentum could be found was brought over the appendiceal stump. The specimen in the Endo Catch bag and removed via the 10 mm  port site. A #1 Vicryl was used to reapproximate the fascia at the umbilical port site x1. The skin was reapproximated all port sites 3-0 Monocryl subcuticular fashion. The skin was dressed with Steri-Strips gauze and tape. The patient was awakened from general anesthesia was taken to recovery room in stable condition.

## 2012-04-28 NOTE — Progress Notes (Signed)
Persistent nausea.  Will keep overnight.  Marta Lamas. Gae Bon, MD, FACS [phone] [facility]

## 2012-04-28 NOTE — Anesthesia Preprocedure Evaluation (Addendum)
Anesthesia Evaluation  Patient identified by MRN, date of birth, ID band Patient awake    Reviewed: Allergy & Precautions, H&P , NPO status , Patient's Chart, lab work & pertinent test results  Airway Mallampati: I TM Distance: <3 FB Neck ROM: full    Dental  (+) Teeth Intact   Pulmonary neg pulmonary ROS, asthma ,  breath sounds clear to auscultation        Cardiovascular negative cardio ROS  Rhythm:Regular Rate:Normal     Neuro/Psych negative neurological ROS  negative psych ROS   GI/Hepatic negative GI ROS, Neg liver ROS,   Endo/Other  negative endocrine ROS  Renal/GU negative Renal ROS  negative genitourinary   Musculoskeletal   Abdominal (+)  Abdomen: tender.    Peds  Hematology negative hematology ROS (+)   Anesthesia Other Findings   Reproductive/Obstetrics negative OB ROS                          Anesthesia Physical Anesthesia Plan  ASA: I and emergent  Anesthesia Plan: General   Post-op Pain Management:    Induction: Intravenous, Rapid sequence and Cricoid pressure planned  Airway Management Planned: Oral ETT  Additional Equipment:   Intra-op Plan:   Post-operative Plan: Extubation in OR  Informed Consent: I have reviewed the patients History and Physical, chart, labs and discussed the procedure including the risks, benefits and alternatives for the proposed anesthesia with the patient or authorized representative who has indicated his/her understanding and acceptance.   Dental Advisory Given  Plan Discussed with: Surgeon and CRNA  Anesthesia Plan Comments:        Anesthesia Quick Evaluation

## 2012-04-28 NOTE — ED Notes (Signed)
Roommates number to contact for updates [phone]. Roommates name Cathlean Sauer

## 2012-04-29 ENCOUNTER — Encounter (HOSPITAL_COMMUNITY): Payer: Self-pay | Admitting: General Surgery

## 2012-04-29 NOTE — ED Provider Notes (Signed)
Medical screening examination/treatment/procedure(s) were conducted as a shared visit with non-physician practitioner(s) and myself.  I personally evaluated the patient during the encounter. RLQ pain with clinical appendicitis   Sunnie Nielsen, MD 04/29/12 [phone]

## 2012-04-29 NOTE — Progress Notes (Signed)
Discharge patient, home discharge instruction given, no questions verbalized. 

## 2012-04-29 NOTE — Discharge Summary (Signed)
Okay to go home after appendectomy.  Madison Strong. Gae Bon, MD, FACS [phone] [facility]

## 2012-04-29 NOTE — Discharge Summary (Signed)
Physician Discharge Summary  Alexine Pilant ZOX:[id number] DOB: 14-Jan-1990 DOA: 04/27/2012  PCP: No PCP Per Patient  Consultation: none  Admit date: 04/27/2012 Discharge date: 04/29/2012  Recommendations for Outpatient Follow-up:  1. Offered to make a follow up appointment for the patient, she will call as she will be going back to [facility] May 2nd. 2. Follow up with pcp 1-2 weeks  Follow-up Information   Follow up with [facility]. Schedule an appointment as soon as possible for a visit in 2 weeks. (please call and ask for [facility] to schedule a follow up)    Contact information:   [address]   [place] [address] [phone]      Discharge Diagnoses:  1. Acute Appendicitis without perforation  Surgical Procedure: Laparoscopic Appendectomy (Dr. Derrell Lolling 04/28/12)  Discharge Condition: stable   Diet recommendation: Regular  Filed Weights   04/28/12 0358  Weight: 120 lb (54.432 kg)       Hospital Course:  This is a healthy 22 year old female who presented to [facility] ED with RUQ abdominal pain.  Radiologic studies unable to visualize appendix and therefore surgery was consulted.  Her symptoms were consistent with acute appendicitis and the patient preceded with laparoscopic appendectomy by Dr. Derrell Lolling.  On POD #1 she was voiding without difficulties, +flatus -BM.  She developed nausea and her pain worsened.  Therefore, the patient was kept overnight for IV antiemetics and pain management.  On POD #2 she was ambulating, tolerating a regular diet and her pain was better controlled with oral medication.  Her nausea was resolved.  Vital signs remained stable throughout her hospital course.  Normal laboratory evaluation.  She was stable for discharge on POD #2.  Dressings removed, steristrips are intact incisions are clean dry and intact.  After care, diet and activity were discussed. Discussed avoiding bathtubs and pools for [redacted] weeks along with  weight restriction for 3 weeks.  Pt verbalizes understanding.  She was provided with prescription for pain medication, antiemetics.  Additionally she may take aleve and apply ice to area prn for pain.  ROS: NAD, denies fever, chills or sweats.  Denies chest pains.  Denies nausea and vomiting.  Denies dysuria.  BM this am.  Physical Exam General: WNWD female, NAD.  Afebrile.  VSS Lungs: CTA Cardiac: S1S2 RRR, no murmurs, gallops.  No edema. Abdomen: soft, round and NT.  +BS x4 quadrants.  Incisions are CDI.  No masses, hernias or organomegaly.   Discharge Instructions     Medication List    TAKE these medications       Green Tea Powd  2 tablets by Does not apply route 2 (two) times daily.     ibuprofen 200 MG tablet  Commonly known as:  ADVIL  Take 2 tablets (400 mg total) by mouth every 8 (eight) hours as needed for pain.     multivitamin with minerals Tabs  Take 1 tablet by mouth daily.     ondansetron 4 MG tablet  Commonly known as:  ZOFRAN  Take 1 tablet (4 mg total) by mouth every 8 (eight) hours as needed for nausea.     oxyCODONE-acetaminophen 5-325 MG per tablet  Commonly known as:  ROXICET  Take 1 tablet by mouth every 6 (six) hours as needed for pain.           Follow-up Information   Follow up with [facility]. Schedule an appointment  as soon as possible for a visit in 2 weeks. (please call and ask for [facility] to schedule a follow up)    Contact information:   [address]   [place] [address] [phone]        The results of significant diagnostics from this hospitalization (including imaging, microbiology, ancillary and laboratory) are listed below for reference.    Significant Diagnostic Studies: Ct Abdomen Pelvis W Contrast  04/28/2012  *RADIOLOGY REPORT*  Clinical Data: Right lower quadrant pain  CT ABDOMEN AND PELVIS WITH CONTRAST  Technique:  Multidetector CT imaging of the abdomen and pelvis was  performed following the standard protocol during bolus administration of intravenous contrast.  Contrast: OMNIPAQUE IOHEXOL 300 MG/ML  SOLN  Comparison: None.  Findings: Limited images through the lung bases demonstrate no significant appreciable abnormality. The heart size is within normal limits. No pleural or pericardial effusion.  Hypodensity adjacent to the falciform ligament, presumably focal fat or variant perfusion.  Otherwise, homogeneous hepatic enhancement.  Unremarkable spleen, biliary system, pancreas, adrenal glands.  Symmetric renal enhancement.  No hydronephrosis or hydroureter.  No CT evidence for colitis.  Appendix not confidently identified. No right lower quadrant inflammation.  No free intraperitoneal air or fluid.  No bowel obstruction.  Small bowel loops are normal course and caliber. No lymphadenopathy.  Normal caliber aorta and branch vessels.  Partially decompressed bladder.  Nonspecific appearance to the uterus and adnexa.  No acute osseous finding.  IMPRESSION: Appendix not identified, therefore cannot exclude appendicitis. However, no right lower quadrant inflammation at this time.   Original Report Authenticated By: Jearld Lesch, M.D.     Microbiology: No results found for this or any previous visit (from the past 240 hour(s)).   Labs: Basic Metabolic Panel:  Recent Labs Lab 04/27/12 2154  NA 140  K 3.6  CL 105  CO2 29  GLUCOSE 93  BUN 10  CREATININE 0.79  CALCIUM 9.6   Liver Function Tests:  Recent Labs Lab 04/27/12 2154  AST 17  ALT 13  ALKPHOS 51  BILITOT 0.3  PROT 7.2  ALBUMIN 3.7     Recent Labs Lab 04/27/12 2154  WBC 8.9  NEUTROABS 5.4  HGB 12.4  HCT 36.1  MCV 84.0  PLT 285   Active Problems:   Acute appendicitis without mention of peritonitis   S/P appendectomy   Signed:  Zeriah Baysinger, ANP-BC

## 2012-05-04 ENCOUNTER — Emergency Department (HOSPITAL_COMMUNITY)
Admission: EM | Admit: 2012-05-04 | Discharge: 2012-05-04 | Disposition: A | Discharge status: Home / Self Care | Payer: BC Managed Care – PPO | Attending: Emergency Medicine | Admitting: Emergency Medicine

## 2012-05-04 ENCOUNTER — Encounter (HOSPITAL_COMMUNITY): Payer: Self-pay | Admitting: *Deleted

## 2012-05-04 DIAGNOSIS — Z9089 Acquired absence of other organs: Secondary | ICD-10-CM | POA: Insufficient documentation

## 2012-05-04 DIAGNOSIS — J45909 Unspecified asthma, uncomplicated: Secondary | ICD-10-CM | POA: Insufficient documentation

## 2012-05-04 DIAGNOSIS — Z8742 Personal history of other diseases of the female genital tract: Secondary | ICD-10-CM | POA: Insufficient documentation

## 2012-05-04 DIAGNOSIS — Z8669 Personal history of other diseases of the nervous system and sense organs: Secondary | ICD-10-CM | POA: Insufficient documentation

## 2012-05-04 DIAGNOSIS — Z79899 Other long term (current) drug therapy: Secondary | ICD-10-CM | POA: Insufficient documentation

## 2012-05-04 DIAGNOSIS — R1084 Generalized abdominal pain: Secondary | ICD-10-CM | POA: Insufficient documentation

## 2012-05-04 DIAGNOSIS — K5900 Constipation, unspecified: Secondary | ICD-10-CM | POA: Insufficient documentation

## 2012-05-04 MED ORDER — POLYETHYLENE GLYCOL 3350 17 G PO PACK: 17.0000 g | PACK | Freq: Every day | ORAL | Status: DC

## 2012-05-04 MED ORDER — DOCUSATE SODIUM 100 MG PO CAPS
100.0000 mg | ORAL_CAPSULE | Freq: Once | ORAL | Status: AC
  Administered 2012-05-04: 100 mg via ORAL
  Filled 2012-05-04: qty 1

## 2012-05-04 NOTE — ED Provider Notes (Signed)
Medical screening examination/treatment/procedure(s) were performed by non-physician practitioner and as supervising physician I was immediately available for consultation/collaboration.  Olivia Mackie, MD 05/04/12 [phone]

## 2012-05-04 NOTE — ED Notes (Signed)
Recent appendectomy last wednesday, c/o constipation since then with increase in abd pain monday around 1200. Last BM monday 1300 (hard and painful). Last BM prior to yesterday was 1 week ago. No meds taken to produce yesterday's BM. Seen by school clinic and given MOM. Has been taking oxycodone & zofran.

## 2012-05-04 NOTE — ED Provider Notes (Signed)
History     CSN: [id number]  Arrival date & time 05/04/12  0053   None     Chief Complaint  Patient presents with  . Abdominal Pain  . Constipation    (Consider location/radiation/quality/duration/timing/severity/associated sxs/prior treatment) HPI History provided by pt.   Pt had an appendectomy last week and has been on oxycodone for pain.  Had first post-op bowel movement today, and it was severely hard and painful.   She has had persistent pain at her rectum ever since as well as diffuse abdominal "soreness" and bloating and nausea.  Denies fever, hematochezia/melena and GU sx.   Past Medical History  Diagnosis Date  . Asthma   . Ovarian cyst   . Headache     Past Surgical History  Procedure Laterality Date  . Tonsillectomy    . Appendectomy  04/28/2012  . Laparoscopic appendectomy N/A 04/28/2012    Procedure: APPENDECTOMY LAPAROSCOPIC;  Surgeon: Axel Filler, MD;  Location: [facility] OR;  Service: General;  Laterality: N/A;    No family history on file.  History  Substance Use Topics  . Smoking status: Never Smoker   . Smokeless tobacco: Never Used  . Alcohol Use: Yes     Comment: socially on the weekend     OB History   Grav Para Term Preterm Abortions TAB SAB Ect Mult Living                  Review of Systems  All other systems reviewed and are negative.    Allergies  Other and Doxycycline  Home Medications   Current Outpatient Rx  Name  Route  Sig  Dispense  Refill  . Green Tea POWD   Does not apply   2 tablets by Does not apply route 2 (two) times daily.         Marland Kitchen ibuprofen (ADVIL) 200 MG tablet   Oral   Take 2 tablets (400 mg total) by mouth every 8 (eight) hours as needed for pain.   30 tablet   0   . Multiple Vitamin (MULTIVITAMIN WITH MINERALS) TABS   Oral   Take 1 tablet by mouth daily.         . ondansetron (ZOFRAN) 4 MG tablet   Oral   Take 1 tablet (4 mg total) by mouth every 8 (eight) hours as needed for nausea.   20  tablet   0   . oxyCODONE-acetaminophen (ROXICET) 5-325 MG per tablet   Oral   Take 1 tablet by mouth every 6 (six) hours as needed for pain.   30 tablet   0   . polyethylene glycol (MIRALAX) packet   Oral   Take 17 g by mouth daily.   5 each   0     BP 113/73  Temp(Src) 98.1 F (36.7 C) (Oral)  Resp 18  SpO2 97%  LMP 04/13/2012  Physical Exam  Nursing note and vitals reviewed. Constitutional: She is oriented to person, place, and time. She appears well-developed and well-nourished. No distress.  HENT:  Head: Normocephalic and atraumatic.  Eyes:  Normal appearance  Neck: Normal range of motion.  Cardiovascular: Normal rate and regular rhythm.   Pulmonary/Chest: Effort normal and breath sounds normal. No respiratory distress.  Abdominal: Soft. Bowel sounds are normal. She exhibits no distension and no mass. There is no rebound and no guarding.  Diffuse "soreness" w/ palpation.  Multiple 2cm surgical scars that appear to be healing well w/out signs of infection.  Genitourinary:  No anal fissures or external hemorrhoids  Musculoskeletal: Normal range of motion.  Neurological: She is alert and oriented to person, place, and time.  Skin: Skin is warm and dry. No rash noted.  Psychiatric: She has a normal mood and affect. Her behavior is normal.    ED Course  Procedures (including critical care time)  Labs Reviewed - No data to display No results found.   1. Constipation       MDM  22yo F presents w/ constipation, likely secondary to frequent dosing of percocet s/p appendectomy last week.  First post-op BM today; strained, hard and painful.  Has diffuse abd "soreness" and bloating and now rectal discomfort.  On exam, afebrile, NAD, abd soft/non-distended, diffusely, mildly ttp, surgical scars healing well w/out signs of infection, nml anus.  Received dose of colace in ED and I recommended same at home + miralax, increased water/fiber and discontinued narcotic pain  medication.  She will f/u w/ her surgeon.  Return precautions discussed. 3:14 AM       Otilio Miu, PA-C 05/04/12 [phone]

## 2013-02-11 ENCOUNTER — Other Ambulatory Visit: Payer: Self-pay

## 2013-02-11 ENCOUNTER — Emergency Department (HOSPITAL_COMMUNITY): Payer: BC Managed Care – PPO

## 2013-02-11 ENCOUNTER — Encounter (HOSPITAL_COMMUNITY): Payer: Self-pay | Admitting: Emergency Medicine

## 2013-02-11 ENCOUNTER — Emergency Department (HOSPITAL_COMMUNITY)
Admission: EM | Admit: 2013-02-11 | Discharge: 2013-02-12 | Disposition: A | Discharge status: Home / Self Care | Payer: BC Managed Care – PPO | Attending: Emergency Medicine | Admitting: Emergency Medicine

## 2013-02-11 DIAGNOSIS — R630 Anorexia: Secondary | ICD-10-CM | POA: Insufficient documentation

## 2013-02-11 DIAGNOSIS — R042 Hemoptysis: Secondary | ICD-10-CM

## 2013-02-11 DIAGNOSIS — Z8742 Personal history of other diseases of the female genital tract: Secondary | ICD-10-CM | POA: Insufficient documentation

## 2013-02-11 DIAGNOSIS — J45909 Unspecified asthma, uncomplicated: Secondary | ICD-10-CM | POA: Insufficient documentation

## 2013-02-11 DIAGNOSIS — R0789 Other chest pain: Secondary | ICD-10-CM | POA: Insufficient documentation

## 2013-02-11 DIAGNOSIS — J029 Acute pharyngitis, unspecified: Secondary | ICD-10-CM | POA: Insufficient documentation

## 2013-02-11 DIAGNOSIS — R110 Nausea: Secondary | ICD-10-CM | POA: Insufficient documentation

## 2013-02-11 LAB — CBC WITH DIFFERENTIAL/PLATELET
BASOS ABS: 0 10*3/uL (ref 0.0–0.1)
BASOS PCT: 0 % (ref 0–1)
Eosinophils Absolute: 0.3 10*3/uL (ref 0.0–0.7)
Eosinophils Relative: 3 % (ref 0–5)
HEMATOCRIT: 37.6 % (ref 36.0–46.0)
HEMOGLOBIN: 12.6 g/dL (ref 12.0–15.0)
LYMPHS PCT: 30 % (ref 12–46)
Lymphs Abs: 2.7 10*3/uL (ref 0.7–4.0)
MCH: 29.1 pg (ref 26.0–34.0)
MCHC: 33.5 g/dL (ref 30.0–36.0)
MCV: 86.8 fL (ref 78.0–100.0)
MONO ABS: 0.7 10*3/uL (ref 0.1–1.0)
MONOS PCT: 7 % (ref 3–12)
NEUTROS ABS: 5.4 10*3/uL (ref 1.7–7.7)
NEUTROS PCT: 59 % (ref 43–77)
Platelets: 314 10*3/uL (ref 150–400)
RBC: 4.33 MIL/uL (ref 3.87–5.11)
RDW: 13.1 % (ref 11.5–15.5)
WBC: 9.1 10*3/uL (ref 4.0–10.5)

## 2013-02-11 NOTE — ED Provider Notes (Signed)
CSN: [id number]     Arrival date & time 02/11/13  2219 History  This chart was scribed for non-physician practitioner Junious SilkHannah Rennae Ferraiolo, PA-C working with Darlys Galesavid Masneri, MD by Joaquin MusicKristina Sanchez-Matthews, ED Scribe. This patient was seen in room TR09C/TR09C and the patient's care was started at 11:16 PM .   Chief Complaint  Patient presents with  . Nausea  . Cough   Patient is a 23 y.o. female presenting with cough. The history is provided by the patient. No language interpreter was used.  Cough Associated symptoms: chest pain   Associated symptoms: no ear pain and no fever    HPI Comments: Elon SpannerMeagan Nistler is a 23 y.o. female who presents to the Emergency Department complaining of ongoing worsening cough, nausea, and CP with associated sore throat due to cough and decreased appetite that began this morning .Pt states she was running last night outdoors and reports coughing profusely with some streaks of blood this morning. Pt states she has been having CP and chest tightness all day. She states her pain worsens with deep breaths.She states she took Reglan (prescribed previously) but denies having any relief. She reports taking Womens Daily vitamins daily. Pt denies taking any OTC medications. Pt denies taking an BC. Pt denies leg swelling, fever, ear pain, congestion, emesis, diarrhea, and abd pain.   Past Medical History  Diagnosis Date  . Asthma   . Ovarian cyst   . WUJWJXBJ(478.2Headache(784.0)    Past Surgical History  Procedure Laterality Date  . Tonsillectomy    . Appendectomy  04/28/2012  . Laparoscopic appendectomy N/A 04/28/2012    Procedure: APPENDECTOMY LAPAROSCOPIC;  Surgeon: Axel FillerArmando Ramirez, MD;  [facility];  Service: General;  Laterality: N/A;   History reviewed. No pertinent family history. History  Substance Use Topics  . Smoking status: Never Smoker   . Smokeless tobacco: Never Used  . Alcohol Use: Yes     Comment: socially on the weekend    OB History   Grav Para Term Preterm  Abortions TAB SAB Ect Mult Living                 Review of Systems  Constitutional: Positive for appetite change. Negative for fever.  HENT: Negative for congestion and ear pain.   Respiratory: Positive for cough and chest tightness.   Cardiovascular: Positive for chest pain.  Gastrointestinal: Positive for nausea. Negative for vomiting and diarrhea.  All other systems reviewed and are negative.    Allergies  Other and Doxycycline  Home Medications   Current Outpatient Rx  Name  Route  Sig  Dispense  Refill  . metoCLOPramide (REGLAN) 10 MG tablet   Oral   Take 10 mg by mouth 3 (three) times daily as needed for nausea.         . Multiple Vitamins-Minerals (WOMENS DAILY FORMULA) TABS   Oral   Take 1 tablet by mouth daily.          BP 116/84  Pulse 58  Temp(Src) 98.1 F (36.7 C) (Oral)  Resp 20  Ht 5\' 5"  (1.651 m)  Wt 133 lb 4 oz (60.442 kg)  BMI 22.17 kg/m2  SpO2 99%  LMP 02/02/2013  Physical Exam  Nursing note and vitals reviewed. Constitutional: She is oriented to person, place, and time. She appears well-developed and well-nourished. No distress.  HENT:  Head: Normocephalic and atraumatic.  Right Ear: External ear normal.  Left Ear: External ear normal.  Nose: Nose normal.  Mouth/Throat: Oropharynx is clear and  moist.  Eyes: Conjunctivae are normal.  Neck: Normal range of motion.  Cardiovascular: Normal rate, regular rhythm and normal heart sounds.   Pulses:      Posterior tibial pulses are 2+ on the right side, and 2+ on the left side.  Pulmonary/Chest: Effort normal and breath sounds normal. No stridor. No respiratory distress. She has no wheezes. She has no rales. She exhibits tenderness.  Chest tenderness to palpitation. No leg swelling. No calf tenderness.  Abdominal: Soft. She exhibits no distension.  Musculoskeletal: Normal range of motion.  Neurological: She is alert and oriented to person, place, and time. She has normal strength.  Skin: Skin  is warm and dry. She is not diaphoretic. No erythema.  Psychiatric: She has a normal mood and affect. Her behavior is normal.    ED Course  Procedures DIAGNOSTIC STUDIES: Oxygen Saturation is 99% on RA, normal by my interpretation.    COORDINATION OF CARE: 11:21 PM-Discussed treatment plan which includes CXR, labs and informed pt ECG was normal. Pt agreed to plan.   Labs Review Labs Reviewed  D-DIMER, QUANTITATIVE  CBC WITH DIFFERENTIAL  BASIC METABOLIC PANEL   Imaging Review Dg Chest 2 View  02/11/2013   CLINICAL DATA:  Cough, chest pain  EXAM: CHEST  2 VIEW  COMPARISON:  None available  FINDINGS: The cardiac and mediastinal silhouettes are within normal limits.  The lungs are normally inflated. No airspace consolidation, pleural effusion, or pulmonary edema is identified. There is no pneumothorax.  No acute osseous abnormality identified.  IMPRESSION: No active cardiopulmonary disease.   Electronically Signed   By: Rise Mu M.D.   On: 02/11/2013 23:47    EKG Interpretation    Date/Time:  Friday February 11 2013 23:04:00 EST Ventricular Rate:  53 PR Interval:  152 QRS Duration: 90 QT Interval:  402 QTC Calculation: 377 R Axis:   90 Text Interpretation:  Sinus bradycardia Rightward axis Borderline ECG Confirmed by MASNERI  MD, DAVID ([id number]) on 02/11/2013 11:07:46 PM           MDM   1. Hemoptysis    Pt presents to ED with hemoptysis. D dimer negative, no tachycardia. Pt is tender to palpation over chest. No concern at this time for PE. Labs are unremarkable. Likely bronchospastic cough or bronchitis. Discussed this with the patient and will give tessalon pearles and albuterol. Tylenol/Advil for pain. Follow up with PCP. Return instructions given. Vital signs stable for discharge. Patient / Family / Caregiver informed of clinical course, understand medical decision-making process, and agree with plan.   I personally performed the services described in this  documentation, which was scribed in my presence. The recorded information has been reviewed and is accurate.     Mora Bellman, PA-C 02/12/13 [phone]

## 2013-02-11 NOTE — ED Notes (Signed)
Pt in NAD, ambulatory to room with steady gait, no respiratory difficulties at this time.

## 2013-02-11 NOTE — ED Notes (Signed)
Patient not feeling well this last week, nauseated and has not been eating well.  Patient states she now has a cough, feels like glass in her chest when she coughs.  Sore throat from the coughing.

## 2013-02-12 LAB — BASIC METABOLIC PANEL
BUN: 13 mg/dL (ref 6–23)
CHLORIDE: 100 meq/L (ref 96–112)
CO2: 26 meq/L (ref 19–32)
CREATININE: 0.67 mg/dL (ref 0.50–1.10)
Calcium: 9.4 mg/dL (ref 8.4–10.5)
GFR calc non Af Amer: >90 mL/min (ref >90)
Glucose, Bld: 93 mg/dL (ref 70–99)
POTASSIUM: 3.8 meq/L (ref 3.7–5.3)
Sodium: 138 mEq/L (ref 137–147)

## 2013-02-12 LAB — D-DIMER, QUANTITATIVE (NOT AT ARMC)

## 2013-02-12 MED ORDER — ALBUTEROL SULFATE HFA 108 (90 BASE) MCG/ACT IN AERS: 2.0000 | INHALATION_SPRAY | RESPIRATORY_TRACT | Status: AC | PRN

## 2013-02-12 MED ORDER — BENZONATATE 100 MG PO CAPS: 200.0000 mg | ORAL_CAPSULE | Freq: Two times a day (BID) | ORAL | Status: DC | PRN

## 2013-02-12 NOTE — Discharge Instructions (Signed)
Cough, Adult  A cough is a reflex. It helps you clear your throat and airways. A cough can help heal your body. A cough can last 2 or 3 weeks (acute) or may last more than 8 weeks (chronic). Some common causes of a cough can include an infection, allergy, or a cold. HOME CARE  Only take medicine as told by your doctor.  If given, take your medicines (antibiotics) as told. Finish them even if you start to feel better.  Use a cold steam vaporizer or humidier in your home. This can help loosen thick spit (secretions).  Sleep so you are almost sitting up (semi-upright). Use pillows to do this. This helps reduce coughing.  Rest as needed.  Stop smoking if you smoke. GET HELP RIGHT AWAY IF:  You have yellowish-white fluid (pus) in your thick spit.  Your cough gets worse.  Your medicine does not reduce coughing, and you are losing sleep.  You cough up blood.  You have trouble breathing.  Your pain gets worse and medicine does not help.  You have a fever. MAKE SURE YOU:   Understand these instructions.  Will watch your condition.  Will get help right away if you are not doing well or get worse. Document Released: 09/05/2010 Document Revised: 03/17/2011 Document Reviewed: 09/05/2010 [facility] Patient Information 2014 [place], [place].  Hemoptysis Hemoptysis, which means coughing up blood, can be a sign of a minor problem or a serious medical condition. The blood that is coughed up may come from the lungs and airways. Coughed-up blood can also come from bleeding that occurs outside the lungs and airways. Blood can drain into the windpipe during a severe nosebleed or when blood is vomited from the stomach. Because hemoptysis can be a sign of something serious, a medical evaluation is required. For some people with hemoptysis, no definite cause is ever identified. CAUSES  The most common cause of hemoptysis is bronchitis. Some other common causes include:   A ruptured blood vessel  caused by coughing or an infection.   A medical condition that causes damage to the large air passageways (bronchiectasis).   A blood clot in the lungs (pulmonary embolism).   Pneumonia.   Tuberculosis.   Breathing in a small foreign object.   Cancer. For some people with hemoptysis, no definite cause is ever identified.  HOME CARE INSTRUCTIONS  Only take over-the-counter or prescription medicines as directed by your caregiver. Do not use cough suppressants unless your caregiver approves.  If your caregiver prescribes antibiotic medicines, take them as directed. Finish them even if you start to feel better.  Do not smoke. Also avoid secondhand smoke.  Follow up with your caregiver as directed. SEEK IMMEDIATE MEDICAL CARE IF:   You cough up bloody mucus for longer than a week.  You have a blood-producing cough that is severe or getting worse.  You have a blood-producing cough thatcomes and goes over time.  You develop problems with your breathing.   You vomit blood.  You develop bloody or black-colored stools.  You have chest pain.   You develop night sweats.  You feel faint or pass out.   You have a fever or persistent symptoms for more than 2 3 days.  You have a fever and your symptoms suddenly get worse. MAKE SURE YOU:  Understand these instructions.  Will watch your condition.  Will get help right away if you are not doing well or get worse. Document Released: 03/03/2001 Document Revised: 12/10/2011 Document Reviewed: 10/10/2011  ExitCare® Patient Information ©2014 [employer]. ° °

## 2013-02-12 NOTE — ED Provider Notes (Signed)
Medical screening examination/treatment/procedure(s) were performed by non-physician practitioner and as supervising physician I was immediately available for consultation/collaboration.  EKG Interpretation    Date/Time:  Friday February 11 2013 23:04:00 EST Ventricular Rate:  53 PR Interval:  152 QRS Duration: 90 QT Interval:  402 QTC Calculation: 377 R Axis:   90 Text Interpretation:  Sinus bradycardia Rightward axis Borderline ECG Confirmed by [facility] CenterMASNERI  MD, DAVID ([id number]) on 02/11/2013 11:07:46 PM              Darlys Galesavid Masneri, MD 02/12/13 1112

## 2013-04-05 ENCOUNTER — Emergency Department (HOSPITAL_COMMUNITY)
Admission: EM | Admit: 2013-04-05 | Discharge: 2013-04-06 | Disposition: A | Discharge status: Home / Self Care | Payer: BC Managed Care – PPO | Attending: Emergency Medicine | Admitting: Emergency Medicine

## 2013-04-05 ENCOUNTER — Encounter (HOSPITAL_COMMUNITY): Payer: Self-pay | Admitting: Emergency Medicine

## 2013-04-05 DIAGNOSIS — R112 Nausea with vomiting, unspecified: Secondary | ICD-10-CM

## 2013-04-05 DIAGNOSIS — Z79899 Other long term (current) drug therapy: Secondary | ICD-10-CM | POA: Insufficient documentation

## 2013-04-05 DIAGNOSIS — Z8742 Personal history of other diseases of the female genital tract: Secondary | ICD-10-CM | POA: Insufficient documentation

## 2013-04-05 DIAGNOSIS — J45909 Unspecified asthma, uncomplicated: Secondary | ICD-10-CM | POA: Insufficient documentation

## 2013-04-05 DIAGNOSIS — Z3202 Encounter for pregnancy test, result negative: Secondary | ICD-10-CM | POA: Insufficient documentation

## 2013-04-05 DIAGNOSIS — R197 Diarrhea, unspecified: Secondary | ICD-10-CM | POA: Insufficient documentation

## 2013-04-05 LAB — CBC WITH DIFFERENTIAL/PLATELET
BASOS ABS: 0 10*3/uL (ref 0.0–0.1)
BASOS PCT: 0 % (ref 0–1)
EOS ABS: 0.1 10*3/uL (ref 0.0–0.7)
Eosinophils Relative: 1 % (ref 0–5)
HCT: 42.5 % (ref 36.0–46.0)
Hemoglobin: 14.4 g/dL (ref 12.0–15.0)
Lymphocytes Relative: 12 % (ref 12–46)
Lymphs Abs: 0.8 10*3/uL (ref 0.7–4.0)
MCH: 29.2 pg (ref 26.0–34.0)
MCHC: 33.9 g/dL (ref 30.0–36.0)
MCV: 86.2 fL (ref 78.0–100.0)
Monocytes Absolute: 0.8 10*3/uL (ref 0.1–1.0)
Monocytes Relative: 12 % (ref 3–12)
Neutro Abs: 5.1 10*3/uL (ref 1.7–7.7)
Neutrophils Relative %: 75 % (ref 43–77)
Platelets: 301 10*3/uL (ref 150–400)
RBC: 4.93 MIL/uL (ref 3.87–5.11)
RDW: 12.6 % (ref 11.5–15.5)
WBC: 6.7 10*3/uL (ref 4.0–10.5)

## 2013-04-05 LAB — WET PREP, GENITAL
CLUE CELLS WET PREP: NONE SEEN
Trich, Wet Prep: NONE SEEN
Yeast Wet Prep HPF POC: NONE SEEN

## 2013-04-05 LAB — URINALYSIS, ROUTINE W REFLEX MICROSCOPIC
Glucose, UA: NEGATIVE mg/dL
Hgb urine dipstick: NEGATIVE
Ketones, ur: 15 mg/dL — AB
LEUKOCYTES UA: NEGATIVE
Nitrite: NEGATIVE
PH: 5.5 (ref 5.0–8.0)
Protein, ur: NEGATIVE mg/dL
SPECIFIC GRAVITY, URINE: 1.035 — AB (ref 1.005–1.030)
Urobilinogen, UA: 0.2 mg/dL (ref 0.0–1.0)

## 2013-04-05 LAB — COMPREHENSIVE METABOLIC PANEL
ALBUMIN: 4.2 g/dL (ref 3.5–5.2)
ALK PHOS: 65 U/L (ref 39–117)
ALT: 17 U/L (ref 0–35)
AST: 19 U/L (ref 0–37)
BUN: 11 mg/dL (ref 6–23)
CO2: 23 mEq/L (ref 19–32)
Calcium: 9.8 mg/dL (ref 8.4–10.5)
Chloride: 105 mEq/L (ref 96–112)
Creatinine, Ser: 0.76 mg/dL (ref 0.50–1.10)
GFR calc Af Amer: >90 mL/min (ref >90)
GFR calc non Af Amer: >90 mL/min (ref >90)
Glucose, Bld: 104 mg/dL — ABNORMAL HIGH (ref 70–99)
POTASSIUM: 4.3 meq/L (ref 3.7–5.3)
Sodium: 143 mEq/L (ref 137–147)
Total Bilirubin: 0.4 mg/dL (ref 0.3–1.2)
Total Protein: 8.8 g/dL — ABNORMAL HIGH (ref 6.0–8.3)

## 2013-04-05 LAB — LIPASE, BLOOD: Lipase: 13 U/L (ref 11–59)

## 2013-04-05 LAB — POC URINE PREG, ED: Preg Test, Ur: NEGATIVE

## 2013-04-05 MED ORDER — SODIUM CHLORIDE 0.9 % IV BOLUS (SEPSIS)
1000.0000 mL | Freq: Once | INTRAVENOUS | Status: AC
  Administered 2013-04-05: 1000 mL via INTRAVENOUS

## 2013-04-05 MED ORDER — MORPHINE SULFATE 4 MG/ML IJ SOLN
4.0000 mg | Freq: Once | INTRAMUSCULAR | Status: DC
  Filled 2013-04-05: qty 1

## 2013-04-05 MED ORDER — ONDANSETRON 4 MG PO TBDP
4.0000 mg | ORAL_TABLET | Freq: Once | ORAL | Status: DC
  Filled 2013-04-05: qty 1

## 2013-04-05 MED ORDER — ONDANSETRON 8 MG PO TBDP
8.0000 mg | ORAL_TABLET | Freq: Once | ORAL | Status: AC
  Administered 2013-04-05: 8 mg via ORAL
  Filled 2013-04-05: qty 1

## 2013-04-05 MED ORDER — PROMETHAZINE HCL 25 MG/ML IJ SOLN
25.0000 mg | Freq: Once | INTRAMUSCULAR | Status: AC
  Administered 2013-04-05: 25 mg via INTRAVENOUS
  Filled 2013-04-05: qty 1

## 2013-04-05 NOTE — ED Provider Notes (Signed)
CSN: [id number]     Arrival date & time 04/05/13  2036 History   First MD Initiated Contact with Patient 04/05/13 2151     Chief Complaint  Patient presents with  . Emesis     (Consider location/radiation/quality/duration/timing/severity/associated sxs/prior Treatment) HPI  23 year old female with history of asthma, ovarian cyst, and recurrent headaches who presents complaining of nausea vomiting and diarrhea. Patient states she has a study group last night and was exposed to one of her friend was recently sick with a viral GI. After this discussion last I started him to develop nausea. She has been vomiting once every hour throughout the day, and has had several episodes of nonbloody non-mucousy diarrhea. She is having low abnormal pain worsening with vomiting. Vomitus is bilious according to her but no blood. Endorse feeling dehydrated, fatigue, and having chills. Denies fever, headache, neck stiffness, chest pain, shortness of breath, productive cough, dysuria, hematuria, hematochezia or melena. Denies any history of diabetes or history of alcohol abuse. She has an appendectomy done last year. Her last menstrual period was 4 days ago. Patient states she hasn't been sexually active for nearly a year.  Past Medical History  Diagnosis Date  . Asthma   . Ovarian cyst   . GEXBMWUX(324.4)    Past Surgical History  Procedure Laterality Date  . Tonsillectomy    . Appendectomy  04/28/2012  . Laparoscopic appendectomy N/A 04/28/2012    Procedure: APPENDECTOMY LAPAROSCOPIC;  Surgeon: Axel Filler, MD;  Location: [facility] OR;  Service: General;  Laterality: N/A;   History reviewed. No pertinent family history. History  Substance Use Topics  . Smoking status: Never Smoker   . Smokeless tobacco: Never Used  . Alcohol Use: Yes     Comment: socially on the weekend    OB History   Grav Para Term Preterm Abortions TAB SAB Ect Mult Living                 Review of Systems  All other systems  reviewed and are negative.      Allergies  Other and Doxycycline  Home Medications   Current Outpatient Rx  Name  Route  Sig  Dispense  Refill  . acetaminophen (TYLENOL) 325 MG tablet   Oral   Take 325 mg by mouth once.         Marland Kitchen albuterol (PROVENTIL HFA;VENTOLIN HFA) 108 (90 BASE) MCG/ACT inhaler   Inhalation   Inhale 2 puffs into the lungs every 2 (two) hours as needed for wheezing or shortness of breath (cough).   1 Inhaler   0   . metoCLOPramide (REGLAN) 10 MG tablet   Oral   Take 10 mg by mouth 3 (three) times daily as needed for nausea.          BP 113/84  Pulse 79  Temp(Src) 99.5 F (37.5 C) (Oral)  Resp 18  Wt 120 lb (54.432 kg)  SpO2 97%  LMP 04/01/2013 Physical Exam  Nursing note and vitals reviewed. Constitutional: She appears well-developed and well-nourished. No distress.  HENT:  Head: Normocephalic and atraumatic.  Eyes: Conjunctivae are normal.  Neck: Normal range of motion. Neck supple.  Cardiovascular: Normal rate and regular rhythm.   Pulmonary/Chest: Effort normal and breath sounds normal. She exhibits no tenderness.  Abdominal: Soft. There is tenderness (tenderness to lower pelvic region without guarding or rebound tenderness).  Genitourinary: Vagina normal and uterus normal. There is no rash or lesion on the right labia. There is no rash or lesion  on the left labia. Cervix exhibits no motion tenderness and no discharge. Right adnexum displays no mass. Left adnexum displays no mass.  Chaperone present:  No inguinal lymphadenopathy. Normal labia majora, and labial minora. Normal vaginal vault with some small amount of brown bloody discharge from cervical os. 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 Patient endorse pain with speculum insertion. She does have mild left and right adnexal tenderness with no cervical motion tenderness. No significant vaginal discharge.  Lymphadenopathy:       Right: No inguinal adenopathy present.        Left: No inguinal adenopathy present.    ED Course  Procedures (including critical care time)  11:00 PM Patient here with nausea vomiting diarrhea and recent viral GI exposure. She does have low abdominal tenderness which I did perform a pelvic examination. She has no cervical motion tenderness concerning for PID. She has ovarian cysts complaint in the past and does have some adnexal tenderness. I have low suspicion for ovarian torsion or tubo-ovarian abscess. She has had appendix removed. She has no upper abdominal tenderness concerning for biliary disease. She is afebrile with stable normal vital sign and her labs are unremarkable.  11:36 PM Pelvic exam performed.  No evidence of yeast, BV, trichomonas.  UA with mild ketone, likely from dehydration.  IVF started.  Normal CBC and CMP result is reassuring.    12:27 AM Pt able to tolerates PO, stable for discharge  Suspect viral gastroenteritis.  Strict return precaution discussed.    Labs Review Labs Reviewed  WET PREP, GENITAL - Abnormal; Notable for the following:    WBC, Wet Prep HPF POC FEW (*)    All other components within normal limits  COMPREHENSIVE METABOLIC PANEL - Abnormal; Notable for the following:    Glucose, Bld 104 (*)    Total Protein 8.8 (*)    All other components within normal limits  URINALYSIS, ROUTINE W REFLEX MICROSCOPIC - Abnormal; Notable for the following:    Color, Urine AMBER (*)    APPearance CLOUDY (*)    Specific Gravity, Urine 1.035 (*)    Bilirubin Urine SMALL (*)    Ketones, ur 15 (*)    All other components within normal limits  GC/CHLAMYDIA PROBE AMP  CBC WITH DIFFERENTIAL  LIPASE, BLOOD  POC URINE PREG, ED   Imaging Review No results found.   EKG Interpretation None      MDM   Final diagnoses:  Nausea vomiting and diarrhea    BP 117/64  Pulse 63  Temp(Src) 98.6 F (37 C) (Oral)  Resp 16  Wt 120 lb (54.432 kg)  SpO2 99%  LMP 04/01/2013     Fayrene HelperBowie Larisa Lanius,  PA-C 04/06/13 [id number]

## 2013-04-05 NOTE — ED Notes (Signed)
Pt reports vomiting since 3am. Pt states that vomiting started with full amounts then went to bile. Pt states she took some phenergan and that did not help and is not able to keep anything down. Phenergan was outdated. Pt reports ab pain from vomiting. Pt alert and ambulatory.

## 2013-04-06 LAB — GC/CHLAMYDIA PROBE AMP
CT Probe RNA: NEGATIVE
GC Probe RNA: NEGATIVE

## 2013-04-06 MED ORDER — MECLIZINE HCL 50 MG PO TABS: 25.0000 mg | ORAL_TABLET | Freq: Three times a day (TID) | ORAL | Status: AC | PRN

## 2013-04-06 MED ORDER — PROMETHAZINE HCL 25 MG PO TABS: 25.0000 mg | ORAL_TABLET | Freq: Four times a day (QID) | ORAL | Status: AC | PRN

## 2013-04-06 NOTE — ED Provider Notes (Signed)
Medical screening examination/treatment/procedure(s) were performed by non-physician practitioner and as supervising physician I was immediately available for consultation/collaboration.   EKG Interpretation None       Flint MelterElliott L Meshia Rau, MD 04/06/13 Paulo [id number]

## 2013-04-06 NOTE — Discharge Instructions (Signed)
Viral Gastroenteritis °Viral gastroenteritis is also known as stomach flu. This condition affects the stomach and intestinal tract. It can cause sudden diarrhea and vomiting. The illness typically lasts 3 to 8 days. Most people develop an immune response that eventually gets rid of the virus. While this natural response develops, the virus can make you quite ill. °CAUSES  °Many different viruses can cause gastroenteritis, such as rotavirus or noroviruses. You can catch one of these viruses by consuming contaminated food or water. You may also catch a virus by sharing utensils or other personal items with an infected person or by touching a contaminated surface. °SYMPTOMS  °The most common symptoms are diarrhea and vomiting. These problems can cause a severe loss of body fluids (dehydration) and a body salt (electrolyte) imbalance. Other symptoms may include: °· Fever. °· Headache. °· Fatigue. °· Abdominal pain. °DIAGNOSIS  °Your caregiver can usually diagnose viral gastroenteritis based on your symptoms and a physical exam. A stool sample may also be taken to test for the presence of viruses or other infections. °TREATMENT  °This illness typically goes away on its own. Treatments are aimed at rehydration. The most serious cases of viral gastroenteritis involve vomiting so severely that you are not able to keep fluids down. In these cases, fluids must be given through an intravenous line (IV). °HOME CARE INSTRUCTIONS  °· Drink enough fluids to keep your urine clear or pale yellow. Drink small amounts of fluids frequently and increase the amounts as tolerated. °· Ask your caregiver for specific rehydration instructions. °· Avoid: °· Foods high in sugar. °· Alcohol. °· Carbonated drinks. °· Tobacco. °· Juice. °· Caffeine drinks. °· Extremely hot or cold fluids. °· Fatty, greasy foods. °· Too much intake of anything at one time. °· Dairy products until 24 to 48 hours after diarrhea stops. °· You may consume probiotics.  Probiotics are active cultures of beneficial bacteria. They may lessen the amount and number of diarrheal stools in adults. Probiotics can be found in yogurt with active cultures and in supplements. °· Wash your hands well to avoid spreading the virus. °· Only take over-the-counter or prescription medicines for pain, discomfort, or fever as directed by your caregiver. Do not give aspirin to children. Antidiarrheal medicines are not recommended. °· Ask your caregiver if you should continue to take your regular prescribed and over-the-counter medicines. °· Keep all follow-up appointments as directed by your caregiver. °SEEK IMMEDIATE MEDICAL CARE IF:  °· You are unable to keep fluids down. °· You do not urinate at least once every 6 to 8 hours. °· You develop shortness of breath. °· You notice blood in your stool or vomit. This may look like coffee grounds. °· You have abdominal pain that increases or is concentrated in one small area (localized). °· You have persistent vomiting or diarrhea. °· You have a fever. °· The patient is a child younger than 3 months, and he or she has a fever. °· The patient is a child older than 3 months, and he or she has a fever and persistent symptoms. °· The patient is a child older than 3 months, and he or she has a fever and symptoms suddenly get worse. °· The patient is a baby, and he or she has no tears when crying. °MAKE SURE YOU:  °· Understand these instructions. °· Will watch your condition. °· Will get help right away if you are not doing well or get worse. °Document Released: 12/23/2004 Document Revised: 03/17/2011 Document Reviewed: 10/09/2010 °  ExitCare Patient Information 2014 [place], [place].   Emergency Department Resource Guide 1) Find a Doctor and Pay Out of Pocket Although you won't have to find out who is covered by your insurance plan, it is a good idea to ask around and get recommendations. You will then need to call the office and see if the doctor you have  chosen will accept you as a new patient and what types of options they offer for patients who are self-pay. Some doctors offer discounts or will set up payment plans for their patients who do not have insurance, but you will need to ask so you aren't surprised when you get to your appointment.  2) Contact Your Local Health Department Not all health departments have doctors that can see patients for sick visits, but many do, so it is worth a call to see if yours does. If you don't know where your local health department is, you can check in your phone book. The CDC also has a tool to help you locate your state's health department, and many state websites also have listings of all of their local health departments.  3) Find a Walk-in Clinic If your illness is not likely to be very severe or complicated, you may want to try a walk in clinic. These are popping up all over the country in pharmacies, drugstores, and shopping centers. They're usually staffed by nurse practitioners or physician assistants that have been trained to treat common illnesses and complaints. They're usually fairly quick and inexpensive. However, if you have serious medical issues or chronic medical problems, these are probably not your best option.  No Primary Care Doctor: - Call Health Connect at  [phone] - they can help you locate a primary care doctor that  accepts your insurance, provides certain services, etc. - Physician Referral Service- [phone]  Chronic Pain Problems: Organization         Address  Phone   Notes  [facility]  [phone] Patients need to be referred by their primary care doctor.   Medication Assistance: Organization         Address  Phone   Notes  [facility] Medication [facility] [address] [place], [place] [address] [phone] --Must be a resident of [facility] -- Must have NO insurance coverage whatsoever (no Medicaid/ Medicare,  etc.) -- The pt. MUST have a primary care doctor that directs their care regularly and follows them in the community   MedAssist  [phone]   [employer]  [phone]    Agencies that provide inexpensive medical care: Organization         Address  Phone   Notes  [facility]  [phone]   [facility]    [phone]   [facility] [address] [place], [place] [address] [phone]) [phone]   [facility] [place] [address], [place] [phone]) [phone]   Planned Parenthood    [phone](336) [phone]   [facility]    [phone](336) [phone]   [facility]  [address], [place] Phone:  [phone]) [phone], Fax:  [phone]) [phone] Hours of Operation:  9 am - 6 pm, M-F.  Also accepts Medicaid/Medicare and self-pay.  [facility]  [address], [place] Phone: [phone], Fax: [phone]) [phone]. Hours of Operation:  8:30 am - 5:30 pm, M-F.  Also  accepts Medicaid and self-pay.  [facility] [place] [address], [place] Phone: [phone](336) [phone]   [facility] [address], [place], [place] [phone], Ext. [phone] Mondays & Thursdays: 7-9 AM.  First 15 patients are seen on a first come, first serve basis.    Medicaid-accepting [facility] Providers:  Organization         Address  Phone   Notes  [facility] [address], [place] [phone] Also accepts self-pay patients.  [facility] [address], [place]  [phone]) [phone]   [facility] [address], [place] [phone]   [facility] [address] [place] [address], [place] [phone]) [phone]   Renaye RakersVeita Bland [address], [place]   [phone]) [phone] Only accepts [place] Access [place] patients after they have their name applied to their card.   Self-Pay (no  insurance) in [facility] [place]:  Organization         Address  Phone   Notes  Sickle Cell Patients, [facility] [address], [place] [phone]) [phone]   [facility] [address], [place] [phone]   [facility]  [address], [place] [phone]) [phone]   [facility]/Dr. Osei-Bonsu  [address] [place] [address], [place] or [phone], [place] [phone]) [phone] Phone number for both [place] and [place] locations is the same.  [facility] [address], [place] [phone]) [phone]   [facility] [address] [place] [address], [place] or [address] [phone]   [facility] [address], [place] [phone]) [phone], phone; [phone]) [phone], fax Sees patients 1st and 3rd Saturday of every month.  Must not qualify for public or private insurance (i.e. Medicaid, Medicare, [place] Health Choice, Veterans' Benefits)  Household income should be no more than 200% of the poverty level The clinic cannot treat you if you are pregnant or think you are pregnant  Sexually transmitted diseases are not treated at the clinic.    Dental Care: Organization         Address  Phone  Notes  [facility] Department of [facility] [address], [place] [phone]) [phone] Accepts children up to age 24 who are enrolled in [place]icaid or [place] Health Choice; pregnant women with a Medicaid card; and children who have applied for Medicaid or [place] Health Choice, but were declined, whose parents can pay a reduced fee at time of service.  [facility] Department of [facility] [place]  [address], [place] [phone]) [phone] Accepts children up to age 24 who are enrolled in [place]icaid or [place] Health Choice; pregnant women with a Medicaid card; and children who have applied for Medicaid or [place] Health Choice, but were  declined, whose parents can pay a reduced fee at time of service.  [facility]  [address], [place] [phone]) [phone] Patients are seen by appointment only. Walk-ins are not accepted. [facility] will see patients 24 years of age and older. Monday - Tuesday (8am-5pm) Most Wednesdays (8:30-5pm) $30 per visit, cash only  [facility]  [address], [facility] [phone] Patients are seen by appointment only. Walk-ins are not accepted. [facility] will  see patients 24 years of age and older. One Wednesday Evening (Monthly: Volunteer Based).  $30 per visit, cash only  [employer] [facility]  [phone]) [phone] for adults; Children under age 504, call Graduate Pediatric Dentistry at [phone]. Children aged 514-14, please call [phone]) [phone] to request a pediatric application.  Dental services are provided in all areas of dental care including fillings, crowns and bridges, complete and partial dentures, implants, gum treatment, root canals, and extractions. Preventive care is also provided. Treatment is provided to both adults and children. Patients are selected via a lottery and there is often a waiting list.   [facility] [address], [place]  [phone]) [phone] [email or web address]   [facility] [address], [place], [place] [phone], Ext. [phone] Second and Fourth Thursday of each month, opens at 6:30 AM; Clinic ends at 9 AM.  Patients are seen on a first-come first-served basis, and a limited number are seen during each clinic.   [facility]  [address], [place], [place] [phone]) [phone]   Eligibility Requirements You must have lived in [place], [place], or [place] counties for at least the last three months.   You cannot be eligible for state or federal sponsored National Cityhealthcare insurance, including CIGNAVeterans Administration, [place]icaid, or Harrah[employer] EntertainmentMedicare.   You generally cannot be  eligible for healthcare insurance through your employer.    How to apply: Eligibility screenings are held every Tuesday and Wednesday afternoon from 1:00 pm until 4:00 pm. You do not need an appointment for the interview!  [facility] [address], [place], [place] [phone]   [facility]  [phone]   [facility]  [phone]   [facility]  [phone]    Behavioral Health Resources in the Community: Intensive Outpatient Programs Organization         Address  Phone  Notes  [facility] [address], [place], [place] [phone]   [facility] [address], [place], [place] [phone]   [facility]: [facility] [address], [place], [place]  [phone]   [facility] [address],  [place], [place] [phone] or [phone]   Substance Abuse Resources Organization         Address  Phone  Notes  [facility] Drug Services  [phone]   [facility]  [phone]   [facility]  [phone]   [facility]  [phone]   Residential & Outpatient Substance Abuse Program  [phone]   Psychological Services Organization         Address  Phone  Notes  [facility]  [phone]- [phone]   [facility]  [phone]- [phone]   [facility] [address], [place] [phone] or [phone]    Mobile Crisis Teams Organization         Address  Phone  Notes  [facility], Mobile Crisis Care Unit  [phone]   [facility]  [address] [place], [place] [phone]   Doristine LocksSharon DeEsch [address] [place] [phone]    Self-Help/Support Groups Organization         Address  Phone             Notes  [facility] [place] - variety of support groups  336- [phone] Call for more information    Narcotics Anonymous (NA), [facility] [address], [place]  2 meetings at this location   Residential Treatment Programs Organization         Address  Phone  Notes  [facility] [address],    [place]  [phone]   [facility]  [address], [place] [id number], [place], [place] [phone]   [facility] [address], [place] [phone] Admissions: 8am-3pm M-F  [facility] [address]    [place], [place] [phone]   [facility] [address], [place], [place] [phone]   [facility] [address]  [place], [place] [phone]   [facility] - Intensive Outpatient [address], [place], [place] [phone]   [facility] ([facility]iction [facility] [address]  [place], [place] [phone] or [phone]   [facility] (RTS) [address] [place], [place] [phone] Accepts Medicaid  [facility] [address]  [place] [phone] Substance Abuse/Addiction Treatment   [facility] Behavioral Health Resources Organization         Address  Phone  Notes  [facility]  [phone]) [phone]   Angie FavaJulie Brannon, PhD [address], [address] [place], [place]   [phone] or [phone]   [facility]   [address] [place], [place] [phone]) [phone]   [facility] [address], [place], [place] [phone] Insurance/Medicaid/sponsorship through [facility]  [facility] [address]                                    [place], [place] [phone] Therapy/tele-psych/case  [facility] [address].   [place], [place] [phone]) [phone]    Dr. Lolly MustacheArfeen  [phone]   [facility]  [employer] [facility] 1) [address], [place] 2) [address], [place] 3)  [address], [place] [phone](336) [phone])  [phone]  [phone]   [facility] Child Abuse Hotline [phone]) [phone] or [phone]) [phone] (After Hours)

## 2014-09-21 ENCOUNTER — Emergency Department (HOSPITAL_COMMUNITY)
Admission: EM | Admit: 2014-09-21 | Discharge: 2014-09-22 | Disposition: A | Discharge status: Home / Self Care | Payer: BLUE CROSS/BLUE SHIELD | Attending: Emergency Medicine | Admitting: Emergency Medicine

## 2014-09-21 ENCOUNTER — Encounter (HOSPITAL_COMMUNITY): Payer: Self-pay | Admitting: Emergency Medicine

## 2014-09-21 ENCOUNTER — Emergency Department (HOSPITAL_COMMUNITY): Payer: BLUE CROSS/BLUE SHIELD

## 2014-09-21 DIAGNOSIS — Y998 Other external cause status: Secondary | ICD-10-CM | POA: Insufficient documentation

## 2014-09-21 DIAGNOSIS — W228XXA Striking against or struck by other objects, initial encounter: Secondary | ICD-10-CM | POA: Insufficient documentation

## 2014-09-21 DIAGNOSIS — J45909 Unspecified asthma, uncomplicated: Secondary | ICD-10-CM | POA: Diagnosis not present

## 2014-09-21 DIAGNOSIS — R55 Syncope and collapse: Secondary | ICD-10-CM | POA: Insufficient documentation

## 2014-09-21 DIAGNOSIS — Y9389 Activity, other specified: Secondary | ICD-10-CM | POA: Insufficient documentation

## 2014-09-21 DIAGNOSIS — S0990XA Unspecified injury of head, initial encounter: Secondary | ICD-10-CM | POA: Insufficient documentation

## 2014-09-21 DIAGNOSIS — Z79899 Other long term (current) drug therapy: Secondary | ICD-10-CM | POA: Insufficient documentation

## 2014-09-21 DIAGNOSIS — G43809 Other migraine, not intractable, without status migrainosus: Secondary | ICD-10-CM | POA: Diagnosis not present

## 2014-09-21 DIAGNOSIS — Z8742 Personal history of other diseases of the female genital tract: Secondary | ICD-10-CM | POA: Diagnosis not present

## 2014-09-21 DIAGNOSIS — Y9289 Other specified places as the place of occurrence of the external cause: Secondary | ICD-10-CM | POA: Diagnosis not present

## 2014-09-21 MED ORDER — DIPHENHYDRAMINE HCL 50 MG/ML IJ SOLN
12.5000 mg | INTRAMUSCULAR | Status: DC
  Filled 2014-09-21: qty 1

## 2014-09-21 MED ORDER — DIPHENHYDRAMINE HCL 50 MG/ML IJ SOLN
12.5000 mg | Freq: Once | INTRAMUSCULAR | Status: AC
  Administered 2014-09-21: 12.5 mg via INTRAMUSCULAR

## 2014-09-21 MED ORDER — KETOROLAC TROMETHAMINE 30 MG/ML IJ SOLN
30.0000 mg | Freq: Once | INTRAMUSCULAR | Status: AC
  Administered 2014-09-21: 30 mg via INTRAMUSCULAR
  Filled 2014-09-21: qty 1

## 2014-09-21 MED ORDER — METOCLOPRAMIDE HCL 5 MG/ML IJ SOLN
10.0000 mg | Freq: Once | INTRAMUSCULAR | Status: AC
  Administered 2014-09-21: 10 mg via INTRAMUSCULAR
  Filled 2014-09-21: qty 2

## 2014-09-21 NOTE — ED Notes (Signed)
Per [facility] pt from home co headache , Hx migraine, per ems pt took her meds with no relief. Per em[facility] pt had syncope witnessed by delivery driver. Pt is alert at this time oriented x 4. VS 122/90 RR 16 CBG 86.

## 2014-09-21 NOTE — ED Provider Notes (Signed)
CSN: [id number]     Arrival date & time 09/21/14  2221 History  This chart was scribed for non-physician practitioner, Earley Favor, NP working with Donnetta Hutching, MD by Doreatha Martin, ED scribe. This patient was seen in room WTR9/WTR9 and the patient's care was started at 10:28 PM    Chief Complaint  Patient presents with  . Migraine   The history is provided by the patient. No language interpreter was used.   HPI Comments: Madison Strong is a 24 y.o. female BIBA with hx of migraines (localized to the back of the head) who presents to the Emergency Department complaining of a syncopal episode tonight. She states associated moderate dorsal HA onset this morning and worsened after syncope to be dorsal and frontal, nausea, photophobia. She states that this is the worst HA of her life with gradual onset She states that she took her prescribed migraine medication at [facility] and took a nap. She states that she woke up, ordered food and collapsed on the food delivery person's car, hitting her head on the door. The driver called EMS. LNMP 1 week ago. Pt allergic to Doxycycline. She denies neck pain, rhinorrhea, sinus pressure.   Past Medical History  Diagnosis Date  . Asthma   . Ovarian cyst   . YQMVHQIO(962.9)    Past Surgical History  Procedure Laterality Date  . Tonsillectomy    . Appendectomy  04/28/2012  . Laparoscopic appendectomy N/A 04/28/2012    Procedure: APPENDECTOMY LAPAROSCOPIC;  Surgeon: Axel Filler, MD;  Location: [facility] OR;  Service: General;  Laterality: N/A;   No family history on file. Social History  Substance Use Topics  . Smoking status: Never Smoker   . Smokeless tobacco: Never Used  . Alcohol Use: Yes     Comment: socially on the weekend    OB History    No data available     Review of Systems  HENT: Negative for rhinorrhea and sinus pressure.   Eyes: Positive for photophobia. Negative for visual disturbance.  Gastrointestinal: Positive for nausea.  Musculoskeletal:  Negative for neck pain and neck stiffness.  Skin: Negative for color change.  Neurological: Positive for syncope and headaches. Negative for dizziness.  All other systems reviewed and are negative.  Allergies  Other and Doxycycline  Home Medications   Prior to Admission medications   Medication Sig Start Date End Date Taking? Authorizing Provider  amitriptyline (ELAVIL) 25 MG tablet Take 25 mg by mouth at bedtime as needed (migraines).   Yes Historical Provider, MD  Multiple Vitamin (MULTIVITAMIN WITH MINERALS) TABS tablet Take 1 tablet by mouth daily.   Yes Historical Provider, MD  albuterol (PROVENTIL HFA;VENTOLIN HFA) 108 (90 BASE) MCG/ACT inhaler Inhale 2 puffs into the lungs every 2 (two) hours as needed for wheezing or shortness of breath (cough). Patient not taking: Reported on 09/21/2014 02/12/13   Junious Silk, PA-C  meclizine (ANTIVERT) 50 MG tablet Take 0.5 tablets (25 mg total) by mouth 3 (three) times daily as needed for dizziness. Patient not taking: Reported on 09/21/2014 04/06/13   Fayrene Helper, PA-C  metoCLOPramide (REGLAN) 10 MG tablet Take 1 tablet (10 mg total) by mouth every 6 (six) hours. 09/22/14   Earley Favor, NP  promethazine (PHENERGAN) 25 MG tablet Take 1 tablet (25 mg total) by mouth every 6 (six) hours as needed for nausea. Patient not taking: Reported on 09/21/2014 04/06/13   Fayrene Helper, PA-C   BP 105/79 mmHg  Pulse 75  Temp(Src) 98.6 F (37 C) (Oral)  Resp 16  SpO2 97%  LMP 09/14/2014 Physical Exam  Constitutional: She is oriented to person, place, and time. She appears well-developed and well-nourished.  HENT:  Head: Normocephalic and atraumatic.  Right Ear: External ear normal.  Left Ear: External ear normal.  Mouth/Throat: Oropharynx is clear and moist.  Eyes: Conjunctivae and EOM are normal. Pupils are equal, round, and reactive to light.  Neck: Normal range of motion. Neck supple. No spinous process tenderness and no muscular tenderness present.   Cardiovascular: Normal rate.   Pulmonary/Chest: Effort normal. No respiratory distress.  Abdominal: She exhibits no distension.  Musculoskeletal: Normal range of motion.  Neurological: She is alert and oriented to person, place, and time.  Skin: Skin is warm and dry.  Psychiatric: She has a normal mood and affect. Her behavior is normal.  Nursing note and vitals reviewed.   ED Course  Procedures (including critical care time) DIAGNOSTIC STUDIES: Oxygen Saturation is 97% on RA, normal by my interpretation.    COORDINATION OF CARE: 10:33 PM Discussed treatment plan with pt at bedside and pt agreed to plan.  Labs Review Labs Reviewed - No data to display  Imaging Review Ct Head Wo Contrast  09/22/2014   CLINICAL DATA:  Syncopal episode, headache. Hit forehead on car door.  EXAM: CT HEAD WITHOUT CONTRAST  TECHNIQUE: Contiguous axial images were obtained from the base of the skull through the vertex without intravenous contrast.  COMPARISON:  None.  FINDINGS: Ventricles, cisterns and other CSF spaces are normal. There is no mass, mass effect, shift of midline structures or acute hemorrhage. There is no evidence of acute infarction. Possible opacification of the superior aspect of the left maxillary sinus. Remaining bones and soft tissues are within normal.  IMPRESSION: No acute intracranial findings.   Electronically Signed   By: Elberta Fortis M.D.   On: 09/22/2014 00:15   I have personally reviewed and evaluated these images and lab results as part of my medical decision-making.   EKG Interpretation None    atient's head CT reviewed.  There is no pathology to be of concern.  She states that her headache has almost resolved.  She is ready  MDM   Final diagnoses:  Other type of migraine without status migrainosus  Head injury without concussion or intracranial hemorrhage, initial encounter  Syncope, unspecified syncope type    I personally performed the services described in this  documentation, which was scribed in my presence. The recorded information has been reviewed and is accurate.  Earley Favor, NP 09/22/14 [id number]  Donnetta Hutching, MD 09/24/14 [phone]

## 2014-09-21 NOTE — ED Notes (Signed)
Bed: WTR9 Expected date:  Expected time:  Means of arrival:  Comments: EMS 24 yo female from [facility]-has a migraine-hx migraine/syncopal episode this afternoon in the parking lot

## 2014-09-22 MED ORDER — METOCLOPRAMIDE HCL 10 MG PO TABS: 10.0000 mg | ORAL_TABLET | Freq: Four times a day (QID) | ORAL | Status: AC

## 2014-09-22 NOTE — Discharge Instructions (Signed)
Please make an appointment with [facility] for further evaluation and regular medical care or your primary care physician

## 2014-11-12 ENCOUNTER — Emergency Department (HOSPITAL_COMMUNITY): Payer: BLUE CROSS/BLUE SHIELD

## 2014-11-12 ENCOUNTER — Encounter (HOSPITAL_COMMUNITY): Payer: Self-pay | Admitting: Oncology

## 2014-11-12 ENCOUNTER — Emergency Department (HOSPITAL_COMMUNITY)
Admission: EM | Admit: 2014-11-12 | Discharge: 2014-11-12 | Disposition: A | Discharge status: Home / Self Care | Payer: BLUE CROSS/BLUE SHIELD | Attending: Emergency Medicine | Admitting: Emergency Medicine

## 2014-11-12 DIAGNOSIS — J45909 Unspecified asthma, uncomplicated: Secondary | ICD-10-CM | POA: Diagnosis not present

## 2014-11-12 DIAGNOSIS — Z8742 Personal history of other diseases of the female genital tract: Secondary | ICD-10-CM | POA: Diagnosis not present

## 2014-11-12 DIAGNOSIS — Z79899 Other long term (current) drug therapy: Secondary | ICD-10-CM | POA: Diagnosis not present

## 2014-11-12 DIAGNOSIS — Z3202 Encounter for pregnancy test, result negative: Secondary | ICD-10-CM | POA: Diagnosis not present

## 2014-11-12 DIAGNOSIS — R112 Nausea with vomiting, unspecified: Secondary | ICD-10-CM | POA: Diagnosis present

## 2014-11-12 DIAGNOSIS — N200 Calculus of kidney: Secondary | ICD-10-CM | POA: Diagnosis not present

## 2014-11-12 LAB — I-STAT BETA HCG BLOOD, ED (MC, WL, AP ONLY)

## 2014-11-12 LAB — URINALYSIS, ROUTINE W REFLEX MICROSCOPIC
BILIRUBIN URINE: NEGATIVE
Glucose, UA: NEGATIVE mg/dL
Hgb urine dipstick: NEGATIVE
Ketones, ur: 40 mg/dL — AB
Leukocytes, UA: NEGATIVE
NITRITE: NEGATIVE
Protein, ur: NEGATIVE mg/dL
Urobilinogen, UA: 0.2 mg/dL (ref 0.0–1.0)
pH: 5 (ref 5.0–8.0)

## 2014-11-12 LAB — COMPREHENSIVE METABOLIC PANEL
ALT: 11 U/L — ABNORMAL LOW (ref 14–54)
ANION GAP: 8 (ref 5–15)
AST: 15 U/L (ref 15–41)
Albumin: 4.3 g/dL (ref 3.5–5.0)
Alkaline Phosphatase: 55 U/L (ref 38–126)
BUN: 10 mg/dL (ref 6–20)
CHLORIDE: 108 mmol/L (ref 101–111)
CO2: 22 mmol/L (ref 22–32)
Calcium: 9.6 mg/dL (ref 8.9–10.3)
Creatinine, Ser: 0.73 mg/dL (ref 0.44–1.00)
GFR calc Af Amer: >60 mL/min (ref >60)
GFR calc non Af Amer: >60 mL/min (ref >60)
GLUCOSE: 108 mg/dL — AB (ref 65–99)
POTASSIUM: 3.4 mmol/L — AB (ref 3.5–5.1)
Sodium: 138 mmol/L (ref 135–145)
Total Bilirubin: 0.9 mg/dL (ref 0.3–1.2)
Total Protein: 8 g/dL (ref 6.5–8.1)

## 2014-11-12 LAB — CBC
HCT: 39.6 % (ref 36.0–46.0)
Hemoglobin: 13.2 g/dL (ref 12.0–15.0)
MCH: 28.8 pg (ref 26.0–34.0)
MCHC: 33.3 g/dL (ref 30.0–36.0)
MCV: 86.3 fL (ref 78.0–100.0)
Platelets: 318 10*3/uL (ref 150–400)
RBC: 4.59 MIL/uL (ref 3.87–5.11)
RDW: 12.5 % (ref 11.5–15.5)
WBC: 10.7 10*3/uL — AB (ref 4.0–10.5)

## 2014-11-12 LAB — HCG, QUANTITATIVE, PREGNANCY: hCG, Beta Chain, Quant, S: <1 m[IU]/mL (ref <5)

## 2014-11-12 LAB — LIPASE, BLOOD: LIPASE: 24 U/L (ref 11–51)

## 2014-11-12 MED ORDER — ONDANSETRON 4 MG PO TBDP: ORAL_TABLET | ORAL | Status: AC

## 2014-11-12 MED ORDER — KETOROLAC TROMETHAMINE 30 MG/ML IJ SOLN
30.0000 mg | Freq: Once | INTRAMUSCULAR | Status: AC
  Administered 2014-11-12: 30 mg via INTRAVENOUS
  Filled 2014-11-12: qty 1

## 2014-11-12 MED ORDER — TAMSULOSIN HCL 0.4 MG PO CAPS: 0.4000 mg | ORAL_CAPSULE | Freq: Every day | ORAL | Status: AC

## 2014-11-12 MED ORDER — SODIUM CHLORIDE 0.9 % IV BOLUS (SEPSIS)
1000.0000 mL | Freq: Once | INTRAVENOUS | Status: AC
  Administered 2014-11-12: 1000 mL via INTRAVENOUS

## 2014-11-12 MED ORDER — HYDROMORPHONE HCL 1 MG/ML IJ SOLN
0.5000 mg | Freq: Once | INTRAMUSCULAR | Status: AC
  Administered 2014-11-12: 0.5 mg via INTRAVENOUS
  Filled 2014-11-12: qty 1

## 2014-11-12 MED ORDER — ONDANSETRON HCL 4 MG/2ML IJ SOLN
4.0000 mg | Freq: Once | INTRAMUSCULAR | Status: AC
  Administered 2014-11-12: 4 mg via INTRAVENOUS
  Filled 2014-11-12: qty 2

## 2014-11-12 MED ORDER — PANTOPRAZOLE SODIUM 40 MG IV SOLR
40.0000 mg | Freq: Once | INTRAVENOUS | Status: AC
  Administered 2014-11-12: 40 mg via INTRAVENOUS
  Filled 2014-11-12: qty 40

## 2014-11-12 MED ORDER — ONDANSETRON HCL 4 MG/2ML IJ SOLN
INTRAMUSCULAR | Status: AC
  Administered 2014-11-12: 4 mg via INTRAVENOUS
  Filled 2014-11-12: qty 2

## 2014-11-12 MED ORDER — HYDROMORPHONE HCL 1 MG/ML IJ SOLN
1.0000 mg | Freq: Once | INTRAMUSCULAR | Status: AC
  Administered 2014-11-12: 1 mg via INTRAVENOUS
  Filled 2014-11-12: qty 1

## 2014-11-12 MED ORDER — IOHEXOL 300 MG/ML  SOLN
100.0000 mL | Freq: Once | INTRAMUSCULAR | Status: AC | PRN
  Administered 2014-11-12: 100 mL via INTRAVENOUS

## 2014-11-12 MED ORDER — OXYCODONE-ACETAMINOPHEN 5-325 MG PO TABS: 2.0000 | ORAL_TABLET | ORAL | Status: AC | PRN

## 2014-11-12 MED ORDER — ONDANSETRON HCL 4 MG/2ML IJ SOLN
4.0000 mg | Freq: Once | INTRAMUSCULAR | Status: AC | PRN
  Administered 2014-11-12: 4 mg via INTRAVENOUS

## 2014-11-12 MED ORDER — IOHEXOL 300 MG/ML  SOLN
25.0000 mL | Freq: Once | INTRAMUSCULAR | Status: AC | PRN
  Administered 2014-11-12: 25 mL via ORAL

## 2014-11-12 MED ORDER — PROMETHAZINE HCL 25 MG/ML IJ SOLN
12.5000 mg | Freq: Once | INTRAMUSCULAR | Status: AC
  Administered 2014-11-12: 12.5 mg via INTRAVENOUS
  Filled 2014-11-12: qty 1

## 2014-11-12 NOTE — ED Notes (Signed)
Patient transported to X-ray 

## 2014-11-12 NOTE — ED Notes (Signed)
Have asked pt multiple times for urine sample. Pt feels unable to get sample at this time.

## 2014-11-12 NOTE — ED Notes (Signed)
Pt presents after 2 days of back pain, N/V and migraine.  +photophobia.  Pt vomiting in triage.

## 2014-11-12 NOTE — Discharge Instructions (Signed)
Follow up with [facility] this week. °

## 2014-11-12 NOTE — ED Provider Notes (Signed)
CSN: [id number]     Arrival date & time 11/12/14  1191 History   First MD Initiated Contact with Patient 11/12/14 [phone]     Chief Complaint  Patient presents with  . Emesis  . Migraine     (Consider location/radiation/quality/duration/timing/severity/associated sxs/prior Treatment) Patient is a 24 y.o. female presenting with vomiting and migraines. The history is provided by the patient (The patient complains of abdominal pain vomiting and back pain.).  Emesis Severity:  Severe Timing:  Constant Quality:  Malodorous material Able to tolerate:  Liquids Progression:  Worsening Chronicity:  New Recent urination:  Normal Relieved by:  Nothing Associated symptoms: no abdominal pain, no diarrhea and no headaches   Migraine Pertinent negatives include no chest pain, no abdominal pain and no headaches.    Past Medical History  Diagnosis Date  . Asthma   . Ovarian cyst   . [id number](657.8)    Past Surgical History  Procedure Laterality Date  . Tonsillectomy    . Appendectomy  04/28/2012  . Laparoscopic appendectomy N/A 04/28/2012    Procedure: APPENDECTOMY LAPAROSCOPIC;  Surgeon: Axel Filler, MD;  Location: [facility] OR;  Service: General;  Laterality: N/A;   No family history on file. Social History  Substance Use Topics  . Smoking status: Never Smoker   . Smokeless tobacco: Never Used  . Alcohol Use: Yes     Comment: socially on the weekend    OB History    No data available     Review of Systems  Constitutional: Negative for appetite change and fatigue.  HENT: Negative for congestion, ear discharge and sinus pressure.   Eyes: Negative for discharge.  Respiratory: Negative for cough.   Cardiovascular: Negative for chest pain.  Gastrointestinal: Positive for vomiting. Negative for abdominal pain and diarrhea.  Genitourinary: Negative for frequency and hematuria.  Musculoskeletal: Negative for back pain.  Skin: Negative for rash.  Neurological: Negative for seizures and  headaches.  Psychiatric/Behavioral: Negative for hallucinations.      Allergies  Other and Doxycycline  Home Medications   Prior to Admission medications   Medication Sig Start Date End Date Taking? Authorizing Provider  amitriptyline (ELAVIL) 25 MG tablet Take 25 mg by mouth at bedtime as needed (migraines).   Yes Historical Provider, MD  metoCLOPramide (REGLAN) 10 MG tablet Take 1 tablet (10 mg total) by mouth every 6 (six) hours. 09/22/14  Yes Earley Favor, NP  albuterol (PROVENTIL HFA;VENTOLIN HFA) 108 (90 BASE) MCG/ACT inhaler Inhale 2 puffs into the lungs every 2 (two) hours as needed for wheezing or shortness of breath (cough). Patient not taking: Reported on 09/21/2014 02/12/13   Junious Silk, PA-C  meclizine (ANTIVERT) 50 MG tablet Take 0.5 tablets (25 mg total) by mouth 3 (three) times daily as needed for dizziness. Patient not taking: Reported on 09/21/2014 04/06/13   Fayrene Helper, PA-C  ondansetron (ZOFRAN ODT) 4 MG disintegrating tablet  ODT q4 hours prn nausea/vomit 11/12/14   Bethann Berkshire, MD  oxyCODONE-acetaminophen (PERCOCET) 5-325 MG tablet Take 2 tablets by mouth every 4 (four) hours as needed. 11/12/14   Bethann Berkshire, MD  promethazine (PHENERGAN) 25 MG tablet Take 1 tablet (25 mg total) by mouth every 6 (six) hours as needed for nausea. Patient not taking: Reported on 09/21/2014 04/06/13   Fayrene Helper, PA-C  tamsulosin (FLOMAX) 0.4 MG CAPS capsule Take 1 capsule (0.4 mg total) by mouth daily. 11/12/14   Bethann Berkshire, MD   BP 112/71 mmHg  Pulse 65  Temp(Src) 97.9 F (36.6 C) (  Oral)  Resp 16  Ht  (1.6 m)  Wt 125 lb (56.7 kg)  BMI 22.15 kg/m2  SpO2 100%  LMP 10/30/2014 Physical Exam  Constitutional: She is oriented to person, place, and time. She appears well-developed.  HENT:  Head: Normocephalic.  Eyes: Conjunctivae and EOM are normal. No scleral icterus.  Neck: Neck supple. No thyromegaly present.  Cardiovascular: Normal rate and regular rhythm.  Exam reveals  no gallop and no friction rub.   No murmur heard. Pulmonary/Chest: No stridor. She has no wheezes. She has no rales. She exhibits no tenderness.  Abdominal: She exhibits no distension. There is tenderness. There is no rebound.  Moderate tenderness all throughout abdomen  Genitourinary:  Tender right flank and left flank worse on right side  Musculoskeletal: Normal range of motion. She exhibits no edema.  Lymphadenopathy:    She has no cervical adenopathy.  Neurological: She is oriented to person, place, and time. She exhibits normal muscle tone. Coordination normal.  Skin: No rash noted. No erythema.  Psychiatric: She has a normal mood and affect. Her behavior is normal.    ED Course  Procedures (including critical care time) Labs Review Labs Reviewed  COMPREHENSIVE METABOLIC PANEL - Abnormal; Notable for the following:    Potassium 3.4 (*)    Glucose, Bld 108 (*)    ALT 11 (*)    All other components within normal limits  CBC - Abnormal; Notable for the following:    WBC 10.7 (*)    All other components within normal limits  URINALYSIS, ROUTINE W REFLEX MICROSCOPIC (NOT AT [facility]) - Abnormal; Notable for the following:    APPearance CLOUDY (*)    Specific Gravity, Urine >1.046 (*)    Ketones, ur 40 (*)    All other components within normal limits  LIPASE, BLOOD  HCG, QUANTITATIVE, PREGNANCY  I-STAT BETA HCG BLOOD, ED ([facility], [facility], [facility] ONLY)    Imaging Review Dg Chest 2 View  11/12/2014  CLINICAL DATA:  Headache, vomiting and back pain EXAM: CHEST  2 VIEW COMPARISON:  02/11/2013 FINDINGS: The heart size and mediastinal contours are within normal limits. Both lungs are clear. The visualized skeletal structures are unremarkable. IMPRESSION: No active cardiopulmonary disease. Electronically Signed   By: Signa Kell M.D.   On: 11/12/2014 10:34   Ct Abdomen Pelvis W Contrast  11/12/2014  CLINICAL DATA:  Back pain, nausea, vomiting and migraine. EXAM: CT ABDOMEN AND PELVIS WITH  CONTRAST TECHNIQUE: Multidetector CT imaging of the abdomen and pelvis was performed using the standard protocol following bolus administration of intravenous contrast. CONTRAST:  25mL OMNIPAQUE IOHEXOL 300 MG/ML SOLN, OMNIPAQUE IOHEXOL 300 MG/ML SOLN COMPARISON:  04/28/2012 FINDINGS: Lower chest: No pleural or pericardial effusion identified. The lung bases appear clear. Hepatobiliary: No suspicious liver abnormalities identified. The gallbladder is normal. No biliary dilatation. Pancreas: Normal appearance of the pancreas. Spleen: The spleen is negative. Adrenals/Urinary Tract: Normal appearance of the adrenal glands. The left kidney is normal. There is right-sided nephro megaly and hydronephrosis. Right hydroureter is identified. Stone within the distal right ureter measures 4 mm, image 39 of series 3. The urinary bladder appears normal. Stomach/Bowel: The stomach is unremarkable. The small bowel loops have a normal course and caliber without obstruction. The appendix is visualized and appears normal. Vascular/Lymphatic: Normal appearance of the abdominal aorta. No enlarged retroperitoneal or mesenteric adenopathy. No enlarged pelvic or inguinal lymph nodes. Reproductive: There is a large physiologic cyst identified within the right ovary measuring 3.8 cm.  Other: Small amount of free fluid noted within the pelvis adjacent to the right ovary. Musculoskeletal: No aggressive lytic or sclerotic bone lesions identified. IMPRESSION: 1. Right-sided obstructive uropathy secondary to distal right ureteral calculus measuring 4 mm. 2. Physiologic right ovarian cyst. 3. Small amount of free fluid noted within the pelvis. Electronically Signed   By: Signa Kellaylor  Stroud M.D.   On: 11/12/2014 10:50   I have personally reviewed and evaluated these images and lab results as part of my medical decision-making.   EKG Interpretation None      MDM   Final diagnoses:  Kidney stone    Patient symptoms improved with  treatment in the ER. Diagnoses kidney stone. Patient given prescription of Percocet Flomax and Zofran. She is to follow-up with urology this week    Bethann BerkshireJoseph Anisha Starliper, MD 11/12/14 1347

## 2014-11-12 NOTE — ED Notes (Signed)
Pt informed we will be doing in and out cath she she returns from xray

## 2014-11-12 NOTE — ED Notes (Signed)
Pt refused in and out cath

## 2014-11-14 ENCOUNTER — Emergency Department (HOSPITAL_COMMUNITY)
Admission: EM | Admit: 2014-11-14 | Discharge: 2014-11-14 | Disposition: A | Discharge status: Home / Self Care | Payer: BLUE CROSS/BLUE SHIELD | Attending: Emergency Medicine | Admitting: Emergency Medicine

## 2014-11-14 ENCOUNTER — Encounter (HOSPITAL_COMMUNITY): Payer: Self-pay | Admitting: Emergency Medicine

## 2014-11-14 DIAGNOSIS — J45909 Unspecified asthma, uncomplicated: Secondary | ICD-10-CM | POA: Insufficient documentation

## 2014-11-14 DIAGNOSIS — Z8742 Personal history of other diseases of the female genital tract: Secondary | ICD-10-CM | POA: Diagnosis not present

## 2014-11-14 DIAGNOSIS — N200 Calculus of kidney: Secondary | ICD-10-CM | POA: Diagnosis not present

## 2014-11-14 DIAGNOSIS — Z79899 Other long term (current) drug therapy: Secondary | ICD-10-CM | POA: Insufficient documentation

## 2014-11-14 DIAGNOSIS — Z9049 Acquired absence of other specified parts of digestive tract: Secondary | ICD-10-CM | POA: Insufficient documentation

## 2014-11-14 DIAGNOSIS — R109 Unspecified abdominal pain: Secondary | ICD-10-CM | POA: Diagnosis present

## 2014-11-14 MED ORDER — ZOLPIDEM TARTRATE 5 MG PO TABS: 5.0000 mg | ORAL_TABLET | Freq: Every evening | ORAL | Status: DC | PRN

## 2014-11-14 MED ORDER — HYDROMORPHONE HCL 1 MG/ML IJ SOLN
1.0000 mg | Freq: Once | INTRAMUSCULAR | Status: AC
  Administered 2014-11-14: 1 mg via INTRAMUSCULAR
  Filled 2014-11-14: qty 1

## 2014-11-14 MED ORDER — ONDANSETRON HCL 4 MG PO TABS: 4.0000 mg | ORAL_TABLET | Freq: Three times a day (TID) | ORAL | Status: DC | PRN

## 2014-11-14 MED ORDER — IBUPROFEN 200 MG PO TABS: 600.0000 mg | ORAL_TABLET | Freq: Three times a day (TID) | ORAL | Status: DC | PRN

## 2014-11-14 MED ORDER — KETOROLAC TROMETHAMINE 60 MG/2ML IM SOLN
60.0000 mg | Freq: Once | INTRAMUSCULAR | Status: AC
  Administered 2014-11-14: 60 mg via INTRAMUSCULAR
  Filled 2014-11-14: qty 2

## 2014-11-14 MED ORDER — LORAZEPAM 1 MG PO TABS: 1.0000 mg | ORAL_TABLET | Freq: Three times a day (TID) | ORAL | Status: DC | PRN

## 2014-11-14 MED ORDER — ACETAMINOPHEN 325 MG PO TABS: 650.0000 mg | ORAL_TABLET | ORAL | Status: DC | PRN

## 2014-11-14 MED ORDER — ONDANSETRON 8 MG PO TBDP
8.0000 mg | ORAL_TABLET | Freq: Once | ORAL | Status: AC
  Administered 2014-11-14: 8 mg via ORAL
  Filled 2014-11-14: qty 1

## 2014-11-14 NOTE — ED Provider Notes (Signed)
CSN: [id number]     Arrival date & time 11/14/14  0038 History  By signing my name below, I, Lyndel Safe, attest that this documentation has been prepared under the direction and in the presence of Gilda Crease, MD. Electronically Signed: Lyndel Safe, ED Scribe. 11/14/2014. 2:19 AM.   Chief Complaint  Patient presents with  . Flank Pain   The history is provided by the patient. No language interpreter was used.   HPI Comments: Madison Strong is a 24 y.o. female, with a PMhx of renal calculi and right ovarian cyst, who presents to the Emergency Department complaining of sudden worsening of right flank pain with associated nausea with emesis attributable to a right renal calculi. She endorses improvement of pain on discharge 1 day ago but notes return of the pain this evening. The pt was evaluated in the ED 1 day ago c/o emesis when a CT of abdomen pelvis revealed a distal, right uretal calculus. Pt was advised to follow up with urology in 1 week and prescribed percocet, Flomax, and Zofran but did not get these prescriptions filled. No fevers.   Past Medical History  Diagnosis Date  . Asthma   . Ovarian cyst   . [id number](478.2)    Past Surgical History  Procedure Laterality Date  . Tonsillectomy    . Appendectomy  04/28/2012  . Laparoscopic appendectomy N/A 04/28/2012    Procedure: APPENDECTOMY LAPAROSCOPIC;  Surgeon: Axel Filler, MD;  Location: [facility] OR;  Service: General;  Laterality: N/A;   History reviewed. No pertinent family history. Social History  Substance Use Topics  . Smoking status: Never Smoker   . Smokeless tobacco: Never Used  . Alcohol Use: Yes     Comment: socially on the weekend    OB History    No data available     Review of Systems  Constitutional: Negative for fever.  Gastrointestinal: Positive for nausea and vomiting.  Genitourinary: Positive for flank pain ( R).  All other systems reviewed and are negative.  Allergies  Other and  Doxycycline  Home Medications   Prior to Admission medications   Medication Sig Start Date End Date Taking? Authorizing Provider  amitriptyline (ELAVIL) 25 MG tablet Take 25 mg by mouth at bedtime as needed (migraines).   Yes Historical Provider, MD  tamsulosin (FLOMAX) 0.4 MG CAPS capsule Take 1 capsule (0.4 mg total) by mouth daily. 11/12/14  Yes Bethann Berkshire, MD  albuterol (PROVENTIL HFA;VENTOLIN HFA) 108 (90 BASE) MCG/ACT inhaler Inhale 2 puffs into the lungs every 2 (two) hours as needed for wheezing or shortness of breath (cough). Patient not taking: Reported on 09/21/2014 02/12/13   Junious Silk, PA-C  meclizine (ANTIVERT) 50 MG tablet Take 0.5 tablets (25 mg total) by mouth 3 (three) times daily as needed for dizziness. Patient not taking: Reported on 09/21/2014 04/06/13   Fayrene Helper, PA-C  metoCLOPramide (REGLAN) 10 MG tablet Take 1 tablet (10 mg total) by mouth every 6 (six) hours. Patient not taking: Reported on 11/14/2014 09/22/14   Earley Favor, NP  ondansetron (ZOFRAN ODT) 4 MG disintegrating tablet  ODT q4 hours prn nausea/vomit Patient not taking: Reported on 11/14/2014 11/12/14   Bethann Berkshire, MD  oxyCODONE-acetaminophen (PERCOCET) 5-325 MG tablet Take 2 tablets by mouth every 4 (four) hours as needed. 11/12/14   Bethann Berkshire, MD  promethazine (PHENERGAN) 25 MG tablet Take 1 tablet (25 mg total) by mouth every 6 (six) hours as needed for nausea. Patient not taking: Reported on 09/21/2014 04/06/13  Fayrene Helper, PA-C   BP 120/80 mmHg  Pulse 94  Temp(Src) 98 F (36.7 C) (Oral)  Resp 22  Ht  (1.6 m)  Wt 125 lb (56.7 kg)  BMI 22.15 kg/m2  SpO2 99%  LMP 10/30/2014 Physical Exam  Constitutional: She is oriented to person, place, and time. She appears well-developed and well-nourished. No distress.  HENT:  Head: Normocephalic and atraumatic.  Right Ear: Hearing normal.  Left Ear: Hearing normal.  Nose: Nose normal.  Mouth/Throat: Oropharynx is clear and moist and mucous  membranes are normal.  Eyes: Conjunctivae and EOM are normal. Pupils are equal, round, and reactive to light.  Neck: Normal range of motion. Neck supple.  Cardiovascular: Regular rhythm, S1 normal and S2 normal.  Exam reveals no gallop and no friction rub.   No murmur heard. Pulmonary/Chest: Effort normal and breath sounds normal. No respiratory distress. She exhibits no tenderness.  Abdominal: Soft. Normal appearance and bowel sounds are normal. There is no hepatosplenomegaly. There is no tenderness. There is CVA tenderness. There is no rebound, no guarding, no tenderness at McBurney's point and negative Murphy's sign. No hernia.  Right CVA tenderness.   Musculoskeletal: Normal range of motion.  Neurological: She is alert and oriented to person, place, and time. She has normal strength. No cranial nerve deficit or sensory deficit. Coordination normal. GCS eye subscore is 4. GCS verbal subscore is 5. GCS motor subscore is 6.  Skin: Skin is warm, dry and intact. No rash noted. No cyanosis.  Psychiatric: She has a normal mood and affect. Her speech is normal and behavior is normal. Thought content normal.  Nursing note and vitals reviewed.   ED Course  Procedures  DIAGNOSTIC STUDIES: Oxygen Saturation is 99% on RA, normal by my interpretation.    COORDINATION OF CARE: 2:16 AM Discussed treatment plan with pt at bedside and pt agreed to plan.  Imaging Review Dg Chest 2 View  11/12/2014  CLINICAL DATA:  Headache, vomiting and back pain EXAM: CHEST  2 VIEW COMPARISON:  02/11/2013 FINDINGS: The heart size and mediastinal contours are within normal limits. Both lungs are clear. The visualized skeletal structures are unremarkable. IMPRESSION: No active cardiopulmonary disease. Electronically Signed   By: Signa Kell M.D.   On: 11/12/2014 10:34   Ct Abdomen Pelvis W Contrast  11/12/2014  CLINICAL DATA:  Back pain, nausea, vomiting and migraine. EXAM: CT ABDOMEN AND PELVIS WITH CONTRAST  TECHNIQUE: Multidetector CT imaging of the abdomen and pelvis was performed using the standard protocol following bolus administration of intravenous contrast. CONTRAST:  25mL OMNIPAQUE IOHEXOL 300 MG/ML SOLN, OMNIPAQUE IOHEXOL 300 MG/ML SOLN COMPARISON:  04/28/2012 FINDINGS: Lower chest: No pleural or pericardial effusion identified. The lung bases appear clear. Hepatobiliary: No suspicious liver abnormalities identified. The gallbladder is normal. No biliary dilatation. Pancreas: Normal appearance of the pancreas. Spleen: The spleen is negative. Adrenals/Urinary Tract: Normal appearance of the adrenal glands. The left kidney is normal. There is right-sided nephro megaly and hydronephrosis. Right hydroureter is identified. Stone within the distal right ureter measures 4 mm, image 39 of series 3. The urinary bladder appears normal. Stomach/Bowel: The stomach is unremarkable. The small bowel loops have a normal course and caliber without obstruction. The appendix is visualized and appears normal. Vascular/Lymphatic: Normal appearance of the abdominal aorta. No enlarged retroperitoneal or mesenteric adenopathy. No enlarged pelvic or inguinal lymph nodes. Reproductive: There is a large physiologic cyst identified within the right ovary measuring 3.8 cm. Other: Small amount of free  fluid noted within the pelvis adjacent to the right ovary. Musculoskeletal: No aggressive lytic or sclerotic bone lesions identified. IMPRESSION: 1. Right-sided obstructive uropathy secondary to distal right ureteral calculus measuring 4 mm. 2. Physiologic right ovarian cyst. 3. Small amount of free fluid noted within the pelvis. Electronically Signed   By: Signa Kellaylor  Stroud M.D.   On: 11/12/2014 10:50   I have personally reviewed and evaluated these images as part of my medical decision-making.   MDM   Final diagnoses:  None  renal colic  Presents to the emergency department for evaluation of right flank pain. Patient seen in  the ER yesterday and diagnosed with distal UVJ stone 0.4 cm. Patient reports that she felt better after she left the hospital, never filled her prescriptions. Over the course of today she has had progressively worsening pain in the right flank accompanied by nausea and vomiting. Patient administered an additional analgesia here in the ER, counseled that she needs to fill the prescriptions for ongoing analgesia and follow-up with urology.  I personally performed the services described in this documentation, which was scribed in my presence. The recorded information has been reviewed and is accurate.     Gilda Creasehristopher J Lucius Wise, MD 11/14/14 [phone]

## 2014-11-14 NOTE — Discharge Instructions (Signed)
Kidney Stones °Kidney stones (urolithiasis) are deposits that form inside your kidneys. The intense pain is caused by the stone moving through the urinary tract. When the stone moves, the ureter goes into spasm around the stone. The stone is usually passed in the urine.  °CAUSES  °· A disorder that makes certain neck glands produce too much parathyroid hormone (primary hyperparathyroidism). °· A buildup of uric acid crystals, similar to gout in your joints. °· Narrowing (stricture) of the ureter. °· A kidney obstruction present at birth (congenital obstruction). °· Previous surgery on the kidney or ureters. °· Numerous kidney infections. °SYMPTOMS  °· Feeling sick to your stomach (nauseous). °· Throwing up (vomiting). °· Blood in the urine (hematuria). °· Pain that usually spreads (radiates) to the groin. °· Frequency or urgency of urination. °DIAGNOSIS  °· Taking a history and physical exam. °· Blood or urine tests. °· CT scan. °· Occasionally, an examination of the inside of the urinary bladder (cystoscopy) is performed. °TREATMENT  °· Observation. °· Increasing your fluid intake. °· Extracorporeal shock wave lithotripsy--This is a noninvasive procedure that uses shock waves to break up kidney stones. °· Surgery may be needed if you have severe pain or persistent obstruction. There are various surgical procedures. Most of the procedures are performed with the use of small instruments. Only small incisions are needed to accommodate these instruments, so recovery time is minimized. °The size, location, and chemical composition are all important variables that will determine the proper choice of action for you. Talk to your health care provider to better understand your situation so that you will minimize the risk of injury to yourself and your kidney.  °HOME CARE INSTRUCTIONS  °· Drink enough water and fluids to keep your urine clear or pale yellow. This will help you to pass the stone or stone fragments. °· Strain  all urine through the provided strainer. Keep all particulate matter and stones for your health care provider to see. The stone causing the pain may be as small as a grain of salt. It is very important to use the strainer each and every time you pass your urine. The collection of your stone will allow your health care provider to analyze it and verify that a stone has actually passed. The stone analysis will often identify what you can do to reduce the incidence of recurrences. °· Only take over-the-counter or prescription medicines for pain, discomfort, or fever as directed by your health care provider. °· Keep all follow-up visits as told by your health care provider. This is important. °· Get follow-up X-rays if required. The absence of pain does not always mean that the stone has passed. It may have only stopped moving. If the urine remains completely obstructed, it can cause loss of kidney function or even complete destruction of the kidney. It is your responsibility to make sure X-rays and follow-ups are completed. Ultrasounds of the kidney can show blockages and the status of the kidney. Ultrasounds are not associated with any radiation and can be performed easily in a matter of minutes. °· Make changes to your daily diet as told by your health care provider. You may be told to: °¨ Limit the amount of salt that you eat. °¨ Eat 5 or more servings of fruits and vegetables each day. °¨ Limit the amount of meat, poultry, fish, and eggs that you eat. °· Collect a 24-hour urine sample as told by your health care provider. You may need to collect another urine sample every 6-12   months. °SEEK MEDICAL CARE IF: °· You experience pain that is progressive and unresponsive to any pain medicine you have been prescribed. °SEEK IMMEDIATE MEDICAL CARE IF:  °· Pain cannot be controlled with the prescribed medicine. °· You have a fever or shaking chills. °· The severity or intensity of pain increases over 18 hours and is not  relieved by pain medicine. °· You develop a new onset of abdominal pain. °· You feel faint or pass out. °· You are unable to urinate. °  °This information is not intended to replace advice given to you by your health care provider. Make sure you discuss any questions you have with your health care provider. °  °Document Released: 12/23/2004 Document Revised: 09/13/2014 Document Reviewed: 05/26/2012 °Elsevier Interactive Patient Education ©2016 [employer] ° °

## 2014-11-14 NOTE — ED Notes (Signed)
Pt states she was seen here yesterday and was diagnosed with a kidney stone  Pt states she did not get her prescriptions filled and tonight her pain came back  Pt states she is having right flank pain and vomiting

## 2015-03-17 ENCOUNTER — Encounter (HOSPITAL_COMMUNITY): Payer: Self-pay | Admitting: *Deleted

## 2015-03-17 ENCOUNTER — Emergency Department (HOSPITAL_COMMUNITY)
Admission: EM | Admit: 2015-03-17 | Discharge: 2015-03-18 | Disposition: A | Discharge status: Home / Self Care | Payer: BLUE CROSS/BLUE SHIELD | Attending: Emergency Medicine | Admitting: Emergency Medicine

## 2015-03-17 DIAGNOSIS — Y9289 Other specified places as the place of occurrence of the external cause: Secondary | ICD-10-CM | POA: Diagnosis not present

## 2015-03-17 DIAGNOSIS — Z79899 Other long term (current) drug therapy: Secondary | ICD-10-CM | POA: Diagnosis not present

## 2015-03-17 DIAGNOSIS — Y998 Other external cause status: Secondary | ICD-10-CM | POA: Insufficient documentation

## 2015-03-17 DIAGNOSIS — X58XXXA Exposure to other specified factors, initial encounter: Secondary | ICD-10-CM | POA: Insufficient documentation

## 2015-03-17 DIAGNOSIS — J45909 Unspecified asthma, uncomplicated: Secondary | ICD-10-CM | POA: Diagnosis not present

## 2015-03-17 DIAGNOSIS — T161XXA Foreign body in right ear, initial encounter: Secondary | ICD-10-CM | POA: Diagnosis present

## 2015-03-17 DIAGNOSIS — Y9389 Activity, other specified: Secondary | ICD-10-CM | POA: Insufficient documentation

## 2015-03-17 DIAGNOSIS — Z8742 Personal history of other diseases of the female genital tract: Secondary | ICD-10-CM | POA: Insufficient documentation

## 2015-03-17 DIAGNOSIS — S00451A Superficial foreign body of right ear, initial encounter: Secondary | ICD-10-CM

## 2015-03-17 NOTE — ED Notes (Signed)
Pt says when she woke up this morning her earring had come off the earring post and the earring post & back remains in her ear, c/o redness, swelling and pain in the right lobe.

## 2015-03-18 MED ORDER — LIDOCAINE-EPINEPHRINE-TETRACAINE (LET) SOLUTION
3.0000 mL | NASAL | Status: AC
  Administered 2015-03-18: 3 mL via TOPICAL
  Filled 2015-03-18: qty 3

## 2015-03-18 NOTE — ED Notes (Signed)
PA at bedside.

## 2015-03-18 NOTE — ED Provider Notes (Signed)
CSN: [id number]     Arrival date & time 03/17/15  2304 History   First MD Initiated Contact with Patient 03/17/15 2345     Chief Complaint  Patient presents with  . Ear Problem     (Consider location/radiation/quality/duration/timing/severity/associated sxs/prior Treatment) HPI Comments: 25 year old female with a history of asthma presents to the emergency department for evaluation of an earring which is stuck in her ear lobe. Patient states that she had her ears pierced 2 weeks ago. She reports being a restless sleeper which causes her to toss and turn during the night. She woke up this morning and noticed that the front of her earring was no longer visible and her piercing had started to heal anteriorly. Earring has remained lodged in the ear lobe since symptoms noticed. Patient complaining of some redness and swelling to her right earlobe. No purulent drainage or fever. No medications taken prior to arrival for symptoms.  The history is provided by the patient. No language interpreter was used.    Past Medical History  Diagnosis Date  . Asthma   . Ovarian cyst   . [id number](478.2Headache(784.0)    Past Surgical History  Procedure Laterality Date  . Tonsillectomy    . Appendectomy  04/28/2012  . Laparoscopic appendectomy N/A 04/28/2012    Procedure: APPENDECTOMY LAPAROSCOPIC;  Surgeon: Axel FillerArmando Ramirez, MD;  [facility];  Service: General;  Laterality: N/A;   No family history on file. Social History  Substance Use Topics  . Smoking status: Never Smoker   . Smokeless tobacco: Never Used  . Alcohol Use: Yes     Comment: socially on the weekend    OB History    No data available      Review of Systems  Skin: Positive for wound.  All other systems reviewed and are negative.   Allergies  Other and Doxycycline  Home Medications   Prior to Admission medications   Medication Sig Start Date End Date Taking? Authorizing Provider  amitriptyline (ELAVIL) 25 MG tablet Take 25 mg by mouth  at bedtime as needed (migraines).   Yes Historical Provider, MD  Ibuprofen (MIDOL) 200 MG CAPS Take 200 mg by mouth every 8 (eight) hours as needed (pain).   Yes Historical Provider, MD  tamsulosin (FLOMAX) 0.4 MG CAPS capsule Take 1 capsule (0.4 mg total) by mouth daily. 11/12/14  Yes Bethann BerkshireJoseph Zammit, MD  albuterol (PROVENTIL HFA;VENTOLIN HFA) 108 (90 BASE) MCG/ACT inhaler Inhale 2 puffs into the lungs every 2 (two) hours as needed for wheezing or shortness of breath (cough). Patient not taking: Reported on 09/21/2014 02/12/13   Junious SilkHannah Merrell, PA-C  meclizine (ANTIVERT) 50 MG tablet Take 0.5 tablets (25 mg total) by mouth 3 (three) times daily as needed for dizziness. Patient not taking: Reported on 09/21/2014 04/06/13   Fayrene HelperBowie Tran, PA-C  metoCLOPramide (REGLAN) 10 MG tablet Take 1 tablet (10 mg total) by mouth every 6 (six) hours. Patient not taking: Reported on 11/14/2014 09/22/14   Earley FavorGail Schulz, NP  ondansetron (ZOFRAN ODT) 4 MG disintegrating tablet 4mg  ODT q4 hours prn nausea/vomit Patient not taking: Reported on 11/14/2014 11/12/14   Bethann BerkshireJoseph Zammit, MD  oxyCODONE-acetaminophen (PERCOCET) 5-325 MG tablet Take 2 tablets by mouth every 4 (four) hours as needed. Patient not taking: Reported on 03/17/2015 11/12/14   Bethann BerkshireJoseph Zammit, MD  promethazine (PHENERGAN) 25 MG tablet Take 1 tablet (25 mg total) by mouth every 6 (six) hours as needed for nausea. Patient not taking: Reported on 09/21/2014 04/06/13   Greta DoomBowie  Laveda Norman, PA-C   BP 131/86 mmHg  Pulse 93  Temp(Src) 97.6 F (36.4 C) (Oral)  Resp 18  Ht  (1.6 m)  Wt 56.7 kg  BMI 22.15 kg/m2  SpO2 97%  LMP  (Within Months)   Physical Exam  Constitutional: She is oriented to person, place, and time. She appears well-developed and well-nourished. No distress.  Nontoxic appearing  HENT:  Head: Normocephalic and atraumatic.  Right Ear: There is swelling. A foreign body (earring stuck in ear lobe) is present.  Left Ear: External ear normal.  Ears:  Eyes:  Conjunctivae and EOM are normal. No scleral icterus.  Neck: Normal range of motion.  Pulmonary/Chest: Effort normal. No respiratory distress.  Musculoskeletal: Normal range of motion.  Neurological: She is alert and oriented to person, place, and time. She exhibits normal muscle tone. Coordination normal.  Skin: Skin is warm and dry. No rash noted. She is not diaphoretic. No erythema. No pallor.  Psychiatric: She has a normal mood and affect. Her behavior is normal.  Nursing note and vitals reviewed.   ED Course  .Foreign Body Removal Date/Time: 03/18/2015 12:34 AM Performed by: Antony Madura Authorized by: Antony Madura Consent: The procedure was performed in an emergent situation. Verbal consent obtained. Written consent not obtained. Risks and benefits: risks, benefits and alternatives were discussed Consent given by: patient Patient understanding: patient states understanding of the procedure being performed Patient consent: the patient's understanding of the procedure matches consent given Procedure consent: procedure consent matches procedure scheduled Relevant documents: relevant documents present and verified Test results: test results available and properly labeled Site marked: the operative site was marked Imaging studies: imaging studies available Required items: required blood products, implants, devices, and special equipment available Patient identity confirmed: verbally with patient and arm band Time out: Immediately prior to procedure a "time out" was called to verify the correct patient, procedure, equipment, support staff and site/side marked as required. Body area: ear Location details: right ear Anesthesia: local infiltration Local anesthetic: LET (lido,epi,tetracaine) Anesthetic total: 3 ml Patient sedated: no Patient restrained: no Patient cooperative: yes Localization method: visualized Complexity: simple 1 objects recovered. Objects recovered:  earring Post-procedure assessment: foreign body removed Patient tolerance: Patient tolerated the procedure well with no immediate complications   (including critical care time) Labs Review Labs Reviewed - No data to display  Imaging Review No results found.   I have personally reviewed and evaluated these images and lab results as part of my medical decision-making.   EKG Interpretation None      MDM   Final diagnoses:  Embedded earring of right ear, initial encounter    Earring removed without complications. No signs of secondary infection or purulent drainage. Return precautions given at discharge. Patient discharged in satisfactory condition.   Filed Vitals:   03/17/15 2312  BP: 131/86  Pulse: 93  Temp: 97.6 F (36.4 C)  TempSrc: Oral  Resp: 18  Height:  (1.6 m)  Weight: 56.7 kg  SpO2: 97%       Antony Madura, PA-C 03/18/15 0121  Devoria Albe, MD 03/18/15 [phone]

## 2015-03-18 NOTE — Discharge Instructions (Signed)
Ear Foreign Body °An ear foreign body is an object that is stuck in your ear. The object is usually stuck in the ear canal. °CAUSES °In all ages of people, the most common foreign bodies are insects that enter the ear canal. It is common for young children to put objects into the ear canal. These may include pebbles, beads, parts of toys, and any other small objects that fit into the ear. In adults, objects such as cotton swabs may become lodged in the ear canal.  °SIGNS AND SYMPTOMS °A foreign body in the ear may cause: °· Pain. °· Buzzing or roaring sounds. °· Hearing loss. °· Ear drainage or bleeding. °· Nausea and vomiting. °· A feeling that your ear is full. °DIAGNOSIS °Your health care provider may be able to diagnose an ear foreign body based on the information that you provide, your symptoms, and a physical exam. Your health care provider may also perform tests, such as testing your hearing and your ear pressure, to check for infection or other problems that are caused by the foreign body in your ear. °TREATMENT °Treatment depends on what the foreign body is, the location of the foreign body in your ear, and whether or not the foreign body has injured any part of your inner ear. If the foreign body is visible to your health care provider, it may be possible to remove the foreign body using: °· A tool, such as medical tweezers (forceps) or a suction tube (catheter). °· Irrigation. This uses water to flush the foreign body out of your ear. This is used only if the foreign body is not likely to swell or enlarge when it is put in water. °If the foreign body is not visible or your health care provider was not able to remove the foreign body, you may be referred to a specialist for removal. You may also be prescribed antibiotic medicine or ear drops to prevent infection. If the foreign body has caused injury to other parts of your ear, you may need additional treatment. °HOME CARE INSTRUCTIONS °· Keep all  follow-up visits as directed by your health care provider. This is important. °· Take medicines only as directed by your health care provider. °· If you were prescribed an antibiotic medicine, finish it all even if you start to feel better. °PREVENTION °· Keep small objects out of reach of young children. Tell children not to put anything in their ears. °· Do not put anything in your ear, including cotton swabs, to clean your ears. Talk to your health care provider about how to clean your ears safely. °SEEK MEDICAL CARE IF: °· You have a headache. °· Your have blood coming from your ear. °· You have a fever. °· You have increased pain or swelling of your ear. °· Your hearing is reduced. °· You have discharge coming from your ear. °  °This information is not intended to replace advice given to you by your health care provider. Make sure you discuss any questions you have with your health care provider. °  °Document Released: 12/21/1999 Document Revised: 01/13/2014 Document Reviewed: 08/08/2013 °Elsevier Interactive Patient Education ©2016 [employer] ° °

## 2017-03-17 IMAGING — CT CT ABD-PELV W/ CM
2 of 4 series · 15 of 46 positions shown, 17 images · IV contrast (OMNIPAQUE 300)
Comparison: 04/28/2012

CLINICAL DATA: Back pain, nausea, vomiting and migraine.

EXAM:
CT ABDOMEN AND PELVIS WITH CONTRAST
TECHNIQUE: Multidetector CT imaging of the abdomen and pelvis was performed
using the standard protocol following bolus administration of
intravenous contrast.
CONTRAST:  25mL OMNIPAQUE IOHEXOL 300 MG/ML SOLN, 100mL OMNIPAQUE
IOHEXOL 300 MG/ML SOLN

[Series 2: abd/pel with · axial · 0.65mm/px · z∈[-471,-61]mm · 12 of 90 slices shown, 14 images]
[im 4/90  soft-tissue]
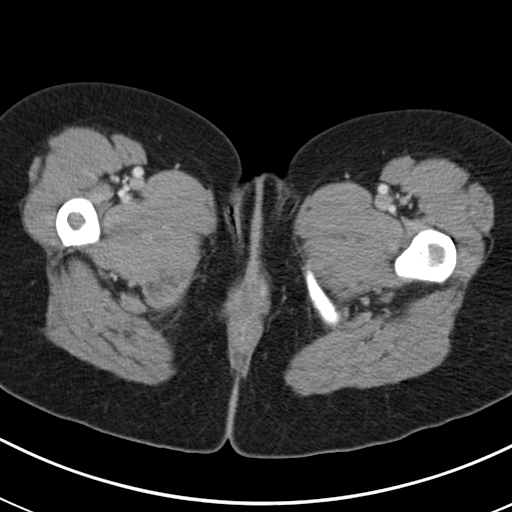
[im 4/90  bone]
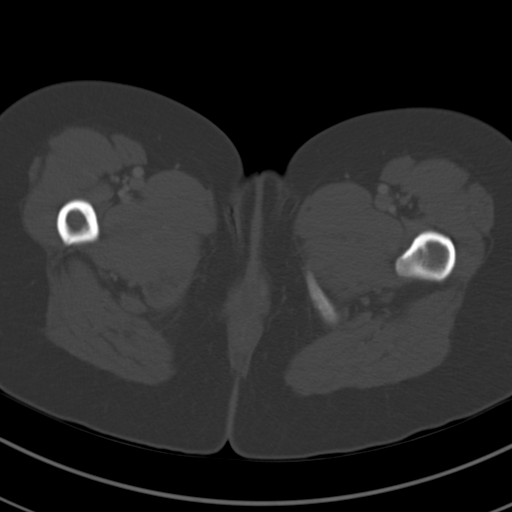
[im 12/90  soft-tissue]
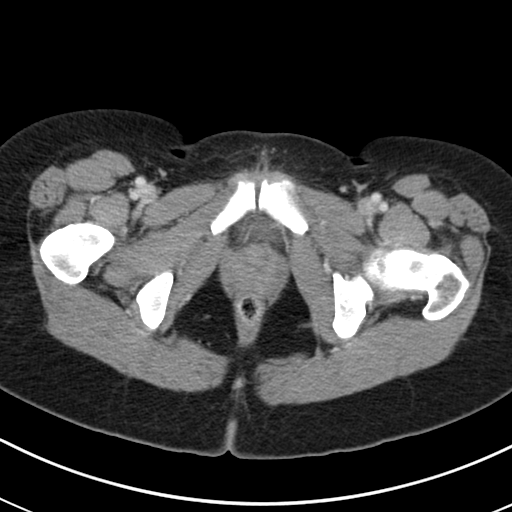
[im 19/90  soft-tissue]
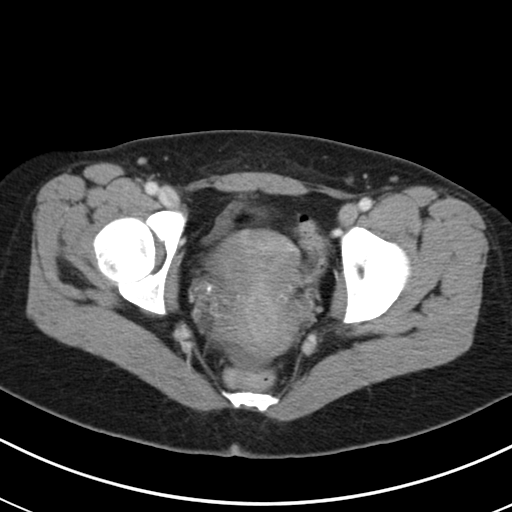
[im 26/90  soft-tissue]
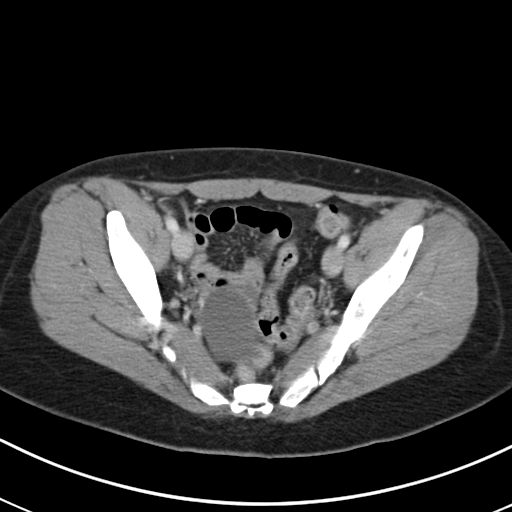
[im 34/90  soft-tissue]
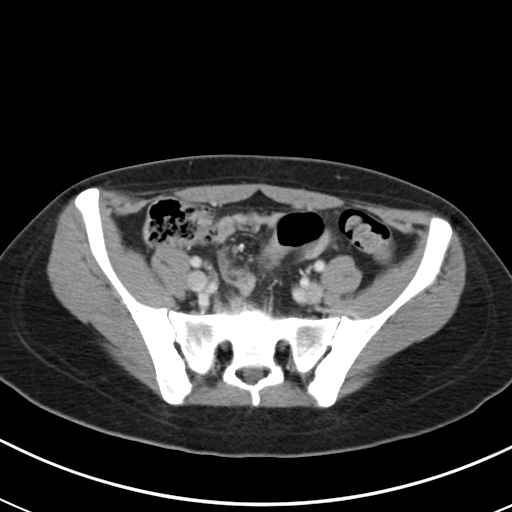
[im 41/90  soft-tissue]
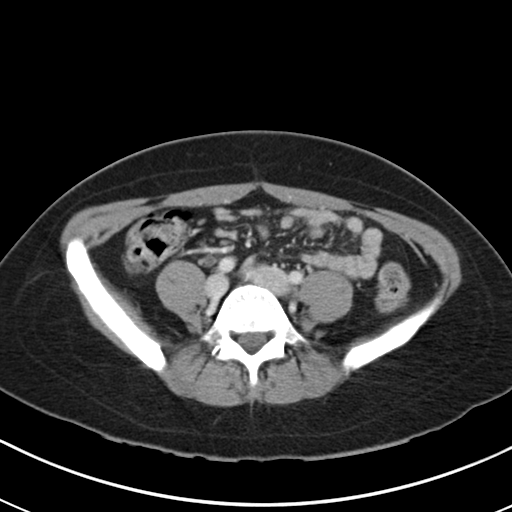
[im 49/90  soft-tissue]
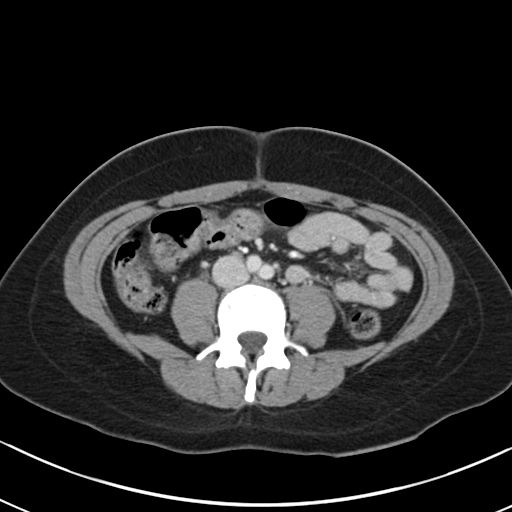
[im 56/90  soft-tissue]
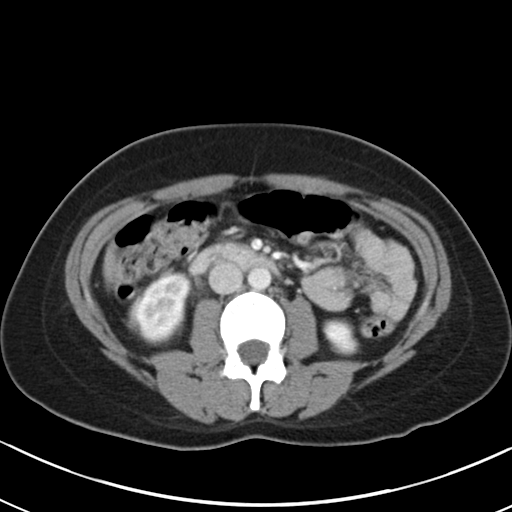
[im 64/90  soft-tissue]
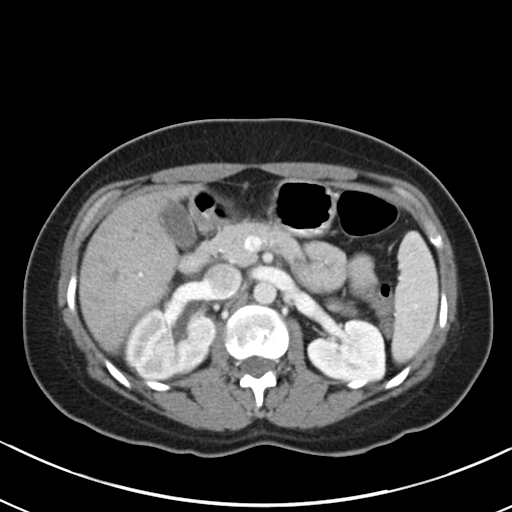
[im 64/90  bone]
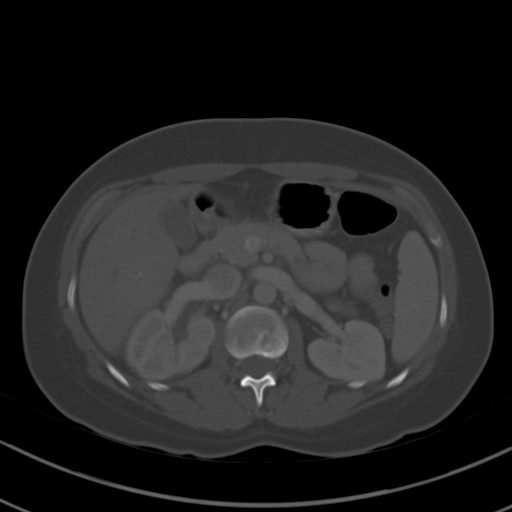
[im 71/90  soft-tissue]
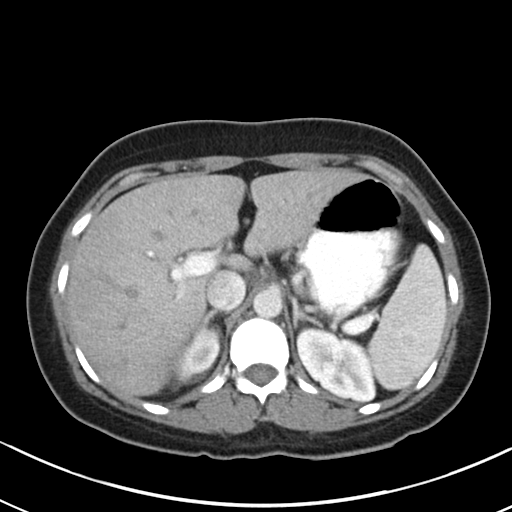
[im 78/90  soft-tissue]
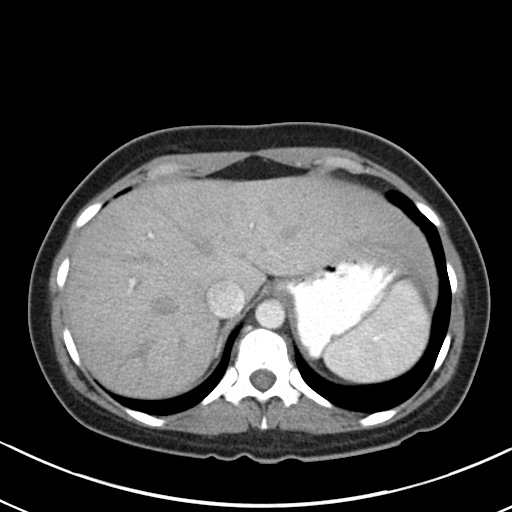
[im 86/90  soft-tissue]
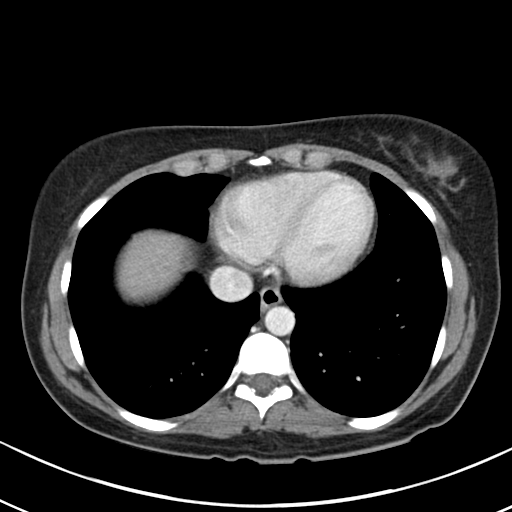

[Series 5: coronal a/|p · coronal · 0.65mm/px · 3 of 79 slices shown]
[im 27/79  soft-tissue]
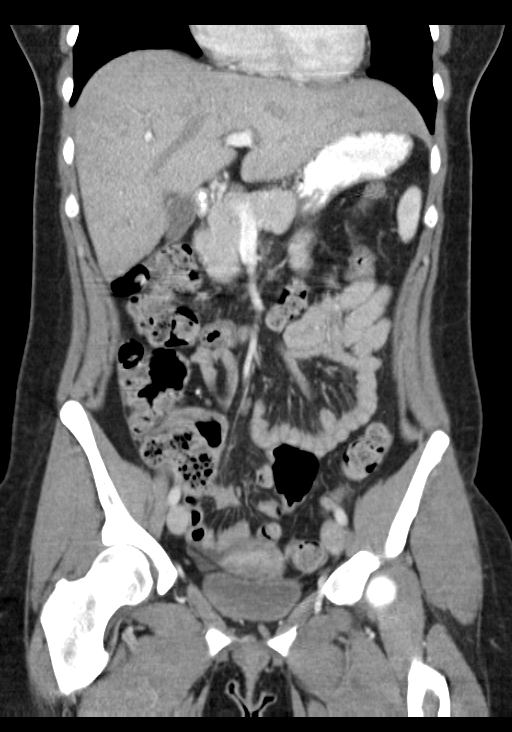
[im 35/79  soft-tissue]
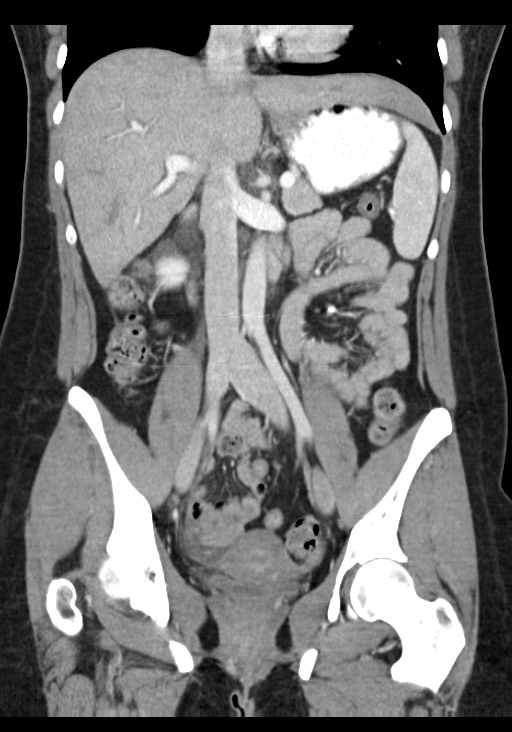
[im 44/79  soft-tissue]
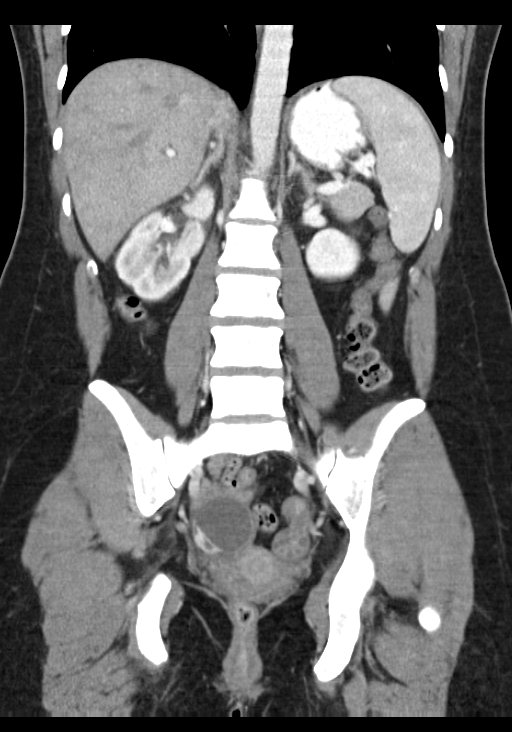

[15 of 46 positions shown; findings below may reference images not displayed]

FINDINGS: Lower chest: No pleural or pericardial effusion identified. The lung
bases appear clear.

Hepatobiliary: No suspicious liver abnormalities identified. The
gallbladder is normal. No biliary dilatation.

Pancreas: Normal appearance of the pancreas.

Spleen: The spleen is negative.

Adrenals/Urinary Tract: Normal appearance of the adrenal glands. The
left kidney is normal. There is right-sided nephro megaly and
hydronephrosis. Right hydroureter is identified. Stone within the
distal right ureter measures 4 mm, image 39 of series 3. The urinary
bladder appears normal.

Stomach/Bowel: The stomach is unremarkable. The small bowel loops
have a normal course and caliber without obstruction. The appendix
is visualized and appears normal.

Vascular/Lymphatic: Normal appearance of the abdominal aorta. No
enlarged retroperitoneal or mesenteric adenopathy. No enlarged
pelvic or inguinal lymph nodes.

Reproductive: There is a large physiologic cyst identified within
the right ovary measuring 3.8 cm.

Other: Small amount of free fluid noted within the pelvis adjacent
to the right ovary.

Musculoskeletal: No aggressive lytic or sclerotic bone lesions
identified.
IMPRESSION: 1. Right-sided obstructive uropathy secondary to distal right
ureteral calculus measuring 4 mm.
2. Physiologic right ovarian cyst.
3. Small amount of free fluid noted within the pelvis.
